# Patient Record
Sex: Female | Born: 1970
Health system: Southern US, Community
[De-identification: ages and names within clinical notes are randomized; demographics above are authoritative.]

## PROBLEM LIST (undated history)

## (undated) DIAGNOSIS — K5792 Diverticulitis of intestine, part unspecified, without perforation or abscess without bleeding: Secondary | ICD-10-CM

## (undated) DIAGNOSIS — E119 Type 2 diabetes mellitus without complications: Secondary | ICD-10-CM

## (undated) DIAGNOSIS — K589 Irritable bowel syndrome without diarrhea: Secondary | ICD-10-CM

## (undated) DIAGNOSIS — F4310 Post-traumatic stress disorder, unspecified: Secondary | ICD-10-CM

## (undated) DIAGNOSIS — I10 Essential (primary) hypertension: Secondary | ICD-10-CM

## (undated) HISTORY — PX: ABDOMINAL HYSTERECTOMY: SHX81

---

## 2001-08-03 ENCOUNTER — Emergency Department (HOSPITAL_COMMUNITY): Admission: EM | Admit: 2001-08-03 | Discharge: 2001-08-03 | Payer: Self-pay | Admitting: Emergency Medicine

## 2003-09-13 ENCOUNTER — Emergency Department (HOSPITAL_COMMUNITY): Admission: EM | Admit: 2003-09-13 | Discharge: 2003-09-13 | Payer: Self-pay | Admitting: Emergency Medicine

## 2005-04-26 ENCOUNTER — Emergency Department: Payer: Self-pay | Admitting: Emergency Medicine

## 2005-08-18 ENCOUNTER — Ambulatory Visit: Admission: RE | Admit: 2005-08-18 | Discharge: 2005-08-18 | Payer: Self-pay | Admitting: Family Medicine

## 2005-09-10 ENCOUNTER — Ambulatory Visit: Payer: Self-pay | Admitting: Pulmonary Disease

## 2005-12-04 ENCOUNTER — Emergency Department (HOSPITAL_COMMUNITY): Admission: EM | Admit: 2005-12-04 | Discharge: 2005-12-05 | Payer: Self-pay | Admitting: Emergency Medicine

## 2006-05-21 ENCOUNTER — Observation Stay (HOSPITAL_COMMUNITY): Admission: EM | Admit: 2006-05-21 | Discharge: 2006-05-22 | Payer: Self-pay | Admitting: Emergency Medicine

## 2006-06-04 ENCOUNTER — Encounter (HOSPITAL_COMMUNITY): Admission: RE | Admit: 2006-06-04 | Discharge: 2006-07-04 | Payer: Self-pay | Admitting: Family Medicine

## 2006-10-26 ENCOUNTER — Emergency Department (HOSPITAL_COMMUNITY): Admission: EM | Admit: 2006-10-26 | Discharge: 2006-10-26 | Payer: Self-pay | Admitting: Emergency Medicine

## 2008-09-08 ENCOUNTER — Emergency Department (HOSPITAL_COMMUNITY): Admission: EM | Admit: 2008-09-08 | Discharge: 2008-09-08 | Payer: Self-pay | Admitting: Emergency Medicine

## 2008-11-21 ENCOUNTER — Emergency Department (HOSPITAL_COMMUNITY): Admission: EM | Admit: 2008-11-21 | Discharge: 2008-11-22 | Payer: Self-pay | Admitting: Emergency Medicine

## 2008-11-29 ENCOUNTER — Ambulatory Visit (HOSPITAL_COMMUNITY): Admission: RE | Admit: 2008-11-29 | Discharge: 2008-11-29 | Payer: Self-pay | Admitting: Orthopaedic Surgery

## 2010-07-21 LAB — GLUCOSE, CAPILLARY: Glucose-Capillary: 102 mg/dL — ABNORMAL HIGH (ref 70–99)

## 2010-08-31 NOTE — Procedures (Signed)
NAMEMIDGE, MOMON NO.:  [id number]   MEDICAL RECORD NO.:  [id number]          PATIENT TYPE:  OUT   LOCATION:  SLEEP LAB                     FACILITY:  [facility]   PHYSICIAN:  Marcelyn Bruins, M.D. [facility] DATE OF BIRTH:  September 24, 1970   DATE OF STUDY:  08/18/2005                              NOCTURNAL POLYSOMNOGRAM   REFERRING PHYSICIAN:  Dr. Lubertha South   INDICATION FOR STUDY:  Hypersomnia with sleep apnea.   EPWORTH SCORE:  10.   SLEEP ARCHITECTURE:  The patient had a total sleep time of 355 minutes with  adequate REM but decreased slow wave sleep for her age.  Sleep onset latency  was normal as was REM onset.  Sleep efficiency was fairly good 92%.   RESPIRATORY DATA:  The patient was found to have 86 hypopneas and 11  obstructive apneas for a respiratory disturbance index of 16 events per  hour.  The events occurred primarily during REM and were not positional.  There was loud snoring noted throughout.  The patient did not meet split  night criteria secondary to the majority of her events occurring after 1  a.m..   OXYGEN DATA:  There was O2 desaturation as low as 83% with her obstructive  events.   CARDIAC:  No clinically significant cardiac arrhythmias.   MOVEMENT/PARASOMNIA:  Small numbers of leg jerks with no significant sleep  disruption.   IMPRESSION/RECOMMENDATION:  Mild obstructive sleep apnea/hypopnea syndrome  with a respiratory disturbance index of 16 events per hour and oxygen  desaturation as low as 83%.  The events occurred primarily during REM.  The  patient did not meet split night criteria secondary to the majority of  events occurring after 1a.m.  Treatment for this degree of sleep apnea can  include weight loss alone if applicable, upper airway surgery, oral  appliance, REM suppressant therapy, and also CPAP.  Clinical correlation is  suggested.                                            ______________________________  Marcelyn Bruins, M.D.  [facility]  Diplomate, American Board of Sleep  Medicine     KC/MEDQ  D:  09/10/2005 12:25:35  T:  09/10/2005 12:58:57  Job:  [id number]

## 2010-08-31 NOTE — Discharge Summary (Signed)
Lisa [name], Lisa [name] NO.:  [id number]   MEDICAL RECORD NO.:  [id number]                  PATIENT TYPE:   LOCATION:                                       FACILITY:   PHYSICIAN:  W. Simone Curia, M.D.             DATE OF BIRTH:   DATE OF ADMISSION:  DATE OF DISCHARGE:                                 DISCHARGE SUMMARY   BRIEF HISTORY:  I was asked by the ER physicians to assess this patient.  She has had chest pain for the past couple days, at times sharp, at times  achy and the patient does smoke, no history of hypertension or diabetes, she  has had some ongoing stress.  The patient had serial cardiac enzymes in the  emergency room which were negative along with EKG which was unremarkable.  The patient has had no nausea, shortness of breath, or diaphoresis.   REVIEW OF SYSTEMS:  Vital signs stable, lungs clear, HEENT normal, distinct  costochondral tenderness, costosternal tenderness right greater than left.  EKG normal.  Enzymes negative.   IMPRESSION:  Chest pain, highly doubt cardiac, discuss with patient.   PLAN:  Anti-inflammatory medicine regularly, add Vicodin p.r.n., follow up  in the office for further evaluation.     ___________________________________________                                         Donna Bernard, M.D.   WSL/MEDQ  D:  09/16/2003  T:  09/17/2003  Job:  [id number]

## 2010-08-31 NOTE — Discharge Summary (Signed)
NAMEMARKESHIA, [name] NO.:  [id number]   MEDICAL RECORD NO.:  [id number]          PATIENT TYPE:  OBV   LOCATION:  2031                         FACILITY:  [facility]   PHYSICIAN:  Dani Gobble, MD       DATE OF BIRTH:  Oct 07, 1970   DATE OF ADMISSION:  05/21/2006  DATE OF DISCHARGE:  05/22/2006                               DISCHARGE SUMMARY   DISCHARGE DIAGNOSES:  1. Chest pain.  History is negative stress test in past.      a.     Negative D-dimer.      b.     Negative myocardial infarction.  2. Presyncope, resolved.  3. Hypertension.  4. Tobacco abuse, instructed on cessation.  5. Obesity.   DISCHARGE CONDITION:  Improved.   DISCHARGE MEDICATIONS:  1. Aspirin 81 mg daily.  2. Lopressor 25 mg twice a day.  3. Protonix 40 mg daily.   DISCHARGE INSTRUCTIONS:  1. Activity without restrictions.  2. A low-sodium Heart Healthy diet.  3. Followup with Dr. Domingo Sep as needed.   HISTORY OF PRESENT ILLNESS:  A 40 year old African-American female  presented to the emergency room at [facility] with complaints of chest pain.  Her symptoms started on May 21, 2006 while at rest.  Described as a  tightness substernally with radiation down her left arm and with arm  heaviness.  The pain was described 9/10 on a scale and associated with  shortness of breath, nausea, and diaphoresis as well as presyncope.  She  has had chest pain in the past, was seen and evaluated by Dr. Domingo Sep,  a negative stress test and negative Holter.  Her symptoms had been  persistent and intermittent since September '07.  Last episode in  January '08 with similar qualities, but the episode of May 21, 2006  was more severe.  She continued in the ER to have chest pain.  Had  received nitro and Vicodin without relief.   PAST MEDICAL HISTORY:  1. Obesity.  2. Hypertension.  3. Dyslipidemia.  4. Negative Myoview in September '07.   ALLERGIES:  NO KNOWN ALLERGIES.   OUTPATIENT MEDS:  1.  Aspirin 81.  2. Lopressor b.i.d.   FAMILY HISTORY/SOCIAL HISTORY, REVIEW OF SYSTEMS:  See H&P.   PHYSICAL EXAM AT DISCHARGE:  VITAL SIGNS:  Blood pressure 116/81, pulse  65, respi is 18, temp 97.2, oxygen saturation on 2 L 100%.  HEART:  S1, S2, regular rate and rhythm.  LUNGS:  Clear.  ABDOMEN:  Obese, positive bowel sounds.   LABORATORY DATA:  Cardiac enzymes, CK-MB:  CK is 71, 95, 87, MB 1.0,  1.0, 0.8.  Troponin-I 0.01, negative MI.  Total cholesterol on February  12:  LDL 157, HDL 31, triglycerides 160.  Magnesium 2.3.  TSH 1.526.  Glycohemoglobin 5.8.  D-dimer less than 0.22.  Hemoglobin 13.6,  hematocrit 38.5, WBC 9.2, platelets 327, BUN 11, creatinine 0.8.   Chest x-ray:  No acute findings.  EKG:  Sinus rhythm, non-specific T-  wave abnormality.   HOSPITAL COURSE:  [name] was admitted by Dr. Tresa Endo  on May 21, 2006  after experiencing chest pain.  She was admitted to a monitored bed.  Serial CK-MBs were done.   Enzymes were negative.  PT was added to medical regimen.  She was seen  and discharged by Dr. Jacinto Halim the next day with no further episodes.  The  tobacco cessation nurse also talked to her, she smokes a half pack per  day, and could not afford any of the med aids available.  She ambulated  without problems and was discharged by Lisa [name], standing nurse  practitioner, on that same day.      Lisa [name]. Lisa [name], N.P.    ______________________________  Dani Gobble, MD    LRI/MEDQ  D:  08/04/2006  T:  08/04/2006  Job:  [id number]   cc:   Dani Gobble, MD

## 2010-09-01 ENCOUNTER — Emergency Department (HOSPITAL_COMMUNITY)
Admission: EM | Admit: 2010-09-01 | Discharge: 2010-09-01 | Disposition: A | Discharge status: Home / Self Care | Payer: Managed Care, Other (non HMO) | Attending: Emergency Medicine | Admitting: Emergency Medicine

## 2010-09-01 ENCOUNTER — Emergency Department (HOSPITAL_COMMUNITY): Payer: Managed Care, Other (non HMO)

## 2010-09-01 DIAGNOSIS — E119 Type 2 diabetes mellitus without complications: Secondary | ICD-10-CM | POA: Insufficient documentation

## 2010-09-01 DIAGNOSIS — K089 Disorder of teeth and supporting structures, unspecified: Secondary | ICD-10-CM | POA: Insufficient documentation

## 2010-09-01 DIAGNOSIS — L03211 Cellulitis of face: Secondary | ICD-10-CM | POA: Insufficient documentation

## 2010-09-01 DIAGNOSIS — E7800 Pure hypercholesterolemia, unspecified: Secondary | ICD-10-CM | POA: Insufficient documentation

## 2010-09-01 DIAGNOSIS — L0201 Cutaneous abscess of face: Secondary | ICD-10-CM | POA: Insufficient documentation

## 2010-09-01 DIAGNOSIS — I10 Essential (primary) hypertension: Secondary | ICD-10-CM | POA: Insufficient documentation

## 2010-09-01 DIAGNOSIS — R220 Localized swelling, mass and lump, head: Secondary | ICD-10-CM | POA: Insufficient documentation

## 2010-09-01 LAB — BASIC METABOLIC PANEL
BUN: 12 mg/dL (ref 6–23)
Calcium: 10.2 mg/dL (ref 8.4–10.5)
Creatinine, Ser: 1 mg/dL (ref 0.4–1.2)
GFR calc non Af Amer: >60 mL/min (ref >60)
Glucose, Bld: 110 mg/dL — ABNORMAL HIGH (ref 70–99)

## 2010-09-01 LAB — CBC
HCT: 38.3 % (ref 36.0–46.0)
MCH: 31.2 pg (ref 26.0–34.0)
MCHC: 34.7 g/dL (ref 30.0–36.0)
MCV: 89.9 fL (ref 78.0–100.0)
Platelets: 295 10*3/uL (ref 150–400)
RDW: 13.8 % (ref 11.5–15.5)

## 2010-09-01 LAB — DIFFERENTIAL
Eosinophils Absolute: 0.3 10*3/uL (ref 0.0–0.7)
Eosinophils Relative: 2 % (ref 0–5)
Lymphocytes Relative: 24 % (ref 12–46)
Lymphs Abs: 3.2 10*3/uL (ref 0.7–4.0)
Monocytes Absolute: 0.8 10*3/uL (ref 0.1–1.0)

## 2010-09-01 MED ORDER — IOHEXOL 300 MG/ML  SOLN
75.0000 mL | Freq: Once | INTRAMUSCULAR | Status: AC | PRN
  Administered 2010-09-01: 75 mL via INTRAVENOUS

## 2012-01-28 ENCOUNTER — Encounter (HOSPITAL_COMMUNITY): Payer: Self-pay | Admitting: *Deleted

## 2012-01-28 ENCOUNTER — Emergency Department (INDEPENDENT_AMBULATORY_CARE_PROVIDER_SITE_OTHER)
Admission: EM | Admit: 2012-01-28 | Discharge: 2012-01-28 | Disposition: A | Discharge status: Home / Self Care | Payer: Managed Care, Other (non HMO) | Source: Home / Self Care | Attending: Family Medicine | Admitting: Family Medicine

## 2012-01-28 DIAGNOSIS — S0003XA Contusion of scalp, initial encounter: Secondary | ICD-10-CM

## 2012-01-28 DIAGNOSIS — S0093XA Contusion of unspecified part of head, initial encounter: Secondary | ICD-10-CM

## 2012-01-28 DIAGNOSIS — R1110 Vomiting, unspecified: Secondary | ICD-10-CM

## 2012-01-28 HISTORY — DX: Type 2 diabetes mellitus without complications: E11.9

## 2012-01-28 HISTORY — DX: Essential (primary) hypertension: I10

## 2012-01-28 MED ORDER — ONDANSETRON 4 MG PO TBDP
ORAL_TABLET | ORAL | Status: AC
  Filled 2012-01-28: qty 1

## 2012-01-28 MED ORDER — ONDANSETRON 4 MG PO TBDP
4.0000 mg | ORAL_TABLET | Freq: Once | ORAL | Status: AC
  Administered 2012-01-28: 4 mg via ORAL

## 2012-01-28 NOTE — ED Notes (Signed)
Pt  Reports        She  Raised  Yesterday  andBumped  Her  Head  On kitchen top   vomitng     As  Well as  Nausea    She  At  This  Time  Is  Awake  As  Well  As   Alert  And  Oriented

## 2012-01-28 NOTE — ED Provider Notes (Signed)
History     CSN: [id number]  Arrival date & time 01/28/12  1108   First MD Initiated Contact with Patient 01/28/12 1113      Chief Complaint  Patient presents with  . Head Injury    (Consider location/radiation/quality/duration/timing/severity/associated sxs/prior treatment) Patient is a 41 y.o. female presenting with head injury. The history is provided by the patient.  Head Injury  The incident occurred 12 to 24 hours ago. She came to the ER via walk-in. The injury mechanism was a direct blow (struck left upper forehead on kitchen counter last eve, had n/v this am.). There was no loss of consciousness. There was no blood loss. The quality of the pain is described as sharp. The pain is mild. Associated symptoms include vomiting.    Past Medical History  Diagnosis Date  . Diabetes mellitus without complication   . Hypertension     Past Surgical History  Procedure Date  . Abdominal hysterectomy     No family history on file.  History  Substance Use Topics  . Smoking status: Not on file  . Smokeless tobacco: Not on file  . Alcohol Use: No    OB History    Grav Para Term Preterm Abortions TAB SAB Ect Mult Living                  Review of Systems  Constitutional: Negative.   HENT: Negative.   Respiratory: Negative.   Gastrointestinal: Positive for nausea and vomiting. Negative for diarrhea.  Skin: Positive for wound.  Neurological: Negative.     Allergies  Review of patient's allergies indicates not on file.  Home Medications  No current outpatient prescriptions on file.  BP 151/96  Pulse 72  Temp 97.4 F (36.3 C) (Oral)  Resp 18  LMP 01/28/2012  Physical Exam  Nursing note and vitals reviewed. Constitutional: She is oriented to person, place, and time. She appears well-developed and well-nourished.  HENT:  Head: Normocephalic.    Right Ear: External ear normal.  Mouth/Throat: Oropharynx is clear and moist.  Eyes: Conjunctivae normal are  normal. Pupils are equal, round, and reactive to light.  Neck: Normal range of motion. Neck supple.  Abdominal: Soft. Bowel sounds are normal. She exhibits no distension and no mass. There is no tenderness. There is no rebound and no guarding.  Neurological: She is alert and oriented to person, place, and time. No cranial nerve deficit.  Skin: Skin is warm and dry.  Psychiatric: She has a normal mood and affect. Her behavior is normal. Judgment and thought content normal.    ED Course  Procedures (including critical care time)  Labs Reviewed - No data to display No results found.   1. Head contusion   2. Vomiting alone       MDM          Linna Hoff, MD 01/28/12 [phone]

## 2012-02-13 ENCOUNTER — Ambulatory Visit (INDEPENDENT_AMBULATORY_CARE_PROVIDER_SITE_OTHER): Payer: Managed Care, Other (non HMO) | Admitting: Physician Assistant

## 2012-02-13 VITALS — BP 117/89 | HR 88 | Temp 98.9°F | Resp 18 | Ht 68.0 in | Wt 255.0 lb

## 2012-02-13 DIAGNOSIS — J029 Acute pharyngitis, unspecified: Secondary | ICD-10-CM

## 2012-02-13 LAB — POCT RAPID STREP A (OFFICE): Rapid Strep A Screen: NEGATIVE

## 2012-02-13 MED ORDER — MAGIC MOUTHWASH W/LIDOCAINE: 10.0000 mL | ORAL | Status: DC | PRN

## 2012-02-13 MED ORDER — AMOXICILLIN 875 MG PO TABS: 875.0000 mg | ORAL_TABLET | Freq: Two times a day (BID) | ORAL | Status: DC

## 2012-02-13 NOTE — Progress Notes (Signed)
  Subjective:    Patient ID: Lisa [name], female    DOB: April 25, 1970, 41 y.o.   MRN: [id number]  HPI 41 year old female presents with acute onset of sore throat yesterday.  States symptoms have worsened today and she complains of chills and some body aches. No documented fever. Denies nausea, vomiting, headache, cough, or nasal congestion. Has taken ibuprofen which has not helped much.      Review of Systems  Constitutional: Positive for chills. Negative for fever.  HENT: Positive for sore throat and postnasal drip. Negative for congestion, rhinorrhea and neck pain.   Gastrointestinal: Negative for nausea, vomiting and abdominal pain.  Skin: Negative for rash.  All other systems reviewed and are negative.       Objective:   Physical Exam  Constitutional: She is oriented to person, place, and time. She appears well-developed and well-nourished.  HENT:  Head: Normocephalic and atraumatic.  Right Ear: External ear normal.  Left Ear: External ear normal.  Mouth/Throat: No oropharyngeal exudate (tonsillar erythema).  Eyes: Conjunctivae normal are normal.  Neck: Normal range of motion.  Cardiovascular: Normal rate, regular rhythm and normal heart sounds.   Pulmonary/Chest: Effort normal and breath sounds normal.  Lymphadenopathy:    She has no cervical adenopathy.  Neurological: She is alert and oriented to person, place, and time.  Psychiatric: She has a normal mood and affect. Her behavior is normal. Judgment and thought content normal.          Assessment & Plan:   1. Acute pharyngitis  POCT rapid strep A, Alum & Mag Hydroxide-Simeth (MAGIC MOUTHWASH W/LIDOCAINE) SOLN, amoxicillin (AMOXIL) 875 MG tablet   Continue ibuprofen prn pain Duke's mouthwash as needed If no improvement in 48-72 hours, may fill amoxicillin Follow up if symptoms worsen or fail to improve

## 2012-07-21 ENCOUNTER — Other Ambulatory Visit: Payer: Self-pay

## 2012-07-21 ENCOUNTER — Emergency Department (HOSPITAL_BASED_OUTPATIENT_CLINIC_OR_DEPARTMENT_OTHER): Payer: Worker's Compensation

## 2012-07-21 ENCOUNTER — Encounter (HOSPITAL_BASED_OUTPATIENT_CLINIC_OR_DEPARTMENT_OTHER): Payer: Self-pay | Admitting: *Deleted

## 2012-07-21 ENCOUNTER — Emergency Department (HOSPITAL_BASED_OUTPATIENT_CLINIC_OR_DEPARTMENT_OTHER)
Admission: EM | Admit: 2012-07-21 | Discharge: 2012-07-21 | Disposition: A | Discharge status: Home / Self Care | Payer: Worker's Compensation | Attending: Emergency Medicine | Admitting: Emergency Medicine

## 2012-07-21 DIAGNOSIS — Y990 Civilian activity done for income or pay: Secondary | ICD-10-CM | POA: Insufficient documentation

## 2012-07-21 DIAGNOSIS — S62639A Displaced fracture of distal phalanx of unspecified finger, initial encounter for closed fracture: Secondary | ICD-10-CM | POA: Insufficient documentation

## 2012-07-21 DIAGNOSIS — W3189XA Contact with other specified machinery, initial encounter: Secondary | ICD-10-CM | POA: Insufficient documentation

## 2012-07-21 DIAGNOSIS — E119 Type 2 diabetes mellitus without complications: Secondary | ICD-10-CM | POA: Insufficient documentation

## 2012-07-21 DIAGNOSIS — I10 Essential (primary) hypertension: Secondary | ICD-10-CM | POA: Insufficient documentation

## 2012-07-21 DIAGNOSIS — Y9269 Other specified industrial and construction area as the place of occurrence of the external cause: Secondary | ICD-10-CM | POA: Insufficient documentation

## 2012-07-21 DIAGNOSIS — F17200 Nicotine dependence, unspecified, uncomplicated: Secondary | ICD-10-CM | POA: Insufficient documentation

## 2012-07-21 MED ORDER — TRAMADOL HCL 50 MG PO TABS: 50.0000 mg | ORAL_TABLET | Freq: Four times a day (QID) | ORAL | Status: DC | PRN

## 2012-07-21 NOTE — ED Notes (Signed)
Patient transported to X-ray 

## 2012-07-21 NOTE — ED Notes (Signed)
Pt mashed her left 4th finger in a machine while at work. Bruising noted under her nail bed with some swelling.

## 2012-07-21 NOTE — ED Provider Notes (Signed)
History     CSN: [id number]  Arrival date & time 07/21/12  7829   None     Chief Complaint  Patient presents with  . Finger Injury    (Consider location/radiation/quality/duration/timing/severity/associated sxs/prior treatment) HPI Comments: Patient presents to the ER for evaluation of injury to left ring finger. Patient reports that the injury occurred while at work, just prior to arrival. Patient reports a crush type injury in a piece of machinery. Patient is complaining of pain at the distal IP joint area. Pain is moderate, worsens with movement.   Past Medical History  Diagnosis Date  . Diabetes mellitus without complication   . Hypertension     Past Surgical History  Procedure Laterality Date  . Abdominal hysterectomy      Family History  Problem Relation Age of Onset  . COPD Mother   . Congestive Heart Failure Father     History  Substance Use Topics  . Smoking status: Current Every Day Smoker  . Smokeless tobacco: Not on file  . Alcohol Use: No    OB History   Grav Para Term Preterm Abortions TAB SAB Ect Mult Living                  Review of Systems  Musculoskeletal:       Finger pain    Allergies  Review of patient's allergies indicates no known allergies.  Home Medications   Current Outpatient Rx  Name  Route  Sig  Dispense  Refill  . cholecalciferol (VITAMIN D) 1000 UNITS tablet   Oral   Take 1,000 Units by mouth daily.         . Multiple Vitamin (MULTIVITAMIN) tablet   Oral   Take 1 tablet by mouth daily.         . Alum & Mag Hydroxide-Simeth (MAGIC MOUTHWASH W/LIDOCAINE) SOLN   Oral   Take 10 mLs by mouth every 2 (two) hours as needed. Use 1:1 ratio of viscous lidocaine and Duke's mouthwash   360 mL   0   . amoxicillin (AMOXIL) 875 MG tablet   Oral   Take 1 tablet (875 mg total) by mouth 2 (two) times daily.   14 tablet   0   . cyanocobalamin (,VITAMIN B-12,) 1000 MCG/ML injection   Intramuscular   Inject 1,000 mcg into  the muscle once.           BP 118/52  Pulse 90  Temp(Src) 98.1 F (36.7 C) (Oral)  Resp 20  Ht 5\' 6"  (1.676 m)  Wt 247 lb (112.038 kg)  BMI 39.89 kg/m2  SpO2 98%  LMP 01/28/2012  Physical Exam  Musculoskeletal:       Hands:   ED Course  Procedures (including critical care time)  Labs Reviewed - No data to display Dg Finger Ring Left  07/21/2012  *RADIOLOGY REPORT*  Clinical Data: Left fourth finger pain, after injury at work.  LEFT RING FINGER 2+V  Comparison: None.  Findings: There is question of a nondisplaced fracture line along the proximal aspect of the distal tuft of the fourth digit. Visualized joint spaces are preserved.  No significant soft tissue abnormalities are characterized on radiograph.  IMPRESSION: Question of a nondisplaced fracture line along the proximal aspect of the distal tuft of the fourth digit.  This may be artifactual in nature.   Original Report Authenticated By: Tonia Ghent, M.D.      Diagnosis: Crush injury left ring finger, possible tuft fracture  MDM  Patient comes to the ER for evaluation after a crush injury to her finger. Patient is complaining of pain in the area of the DIP joint. X-ray does not show any injury in this region, but there is a question of a tuft fracture. She does have a small amount of ecchymosis under the fingernail and therefore this finding, although subtle, it is likely a positive fracture. The fractures, however, did not require any treatment. Patient will be splinted for comfort. Analgesia as needed.       Gilda Crease, MD 07/21/12 [phone]

## 2012-07-21 NOTE — ED Notes (Signed)
Pt returned from xray

## 2012-12-11 ENCOUNTER — Encounter (HOSPITAL_COMMUNITY): Payer: Self-pay | Admitting: Emergency Medicine

## 2012-12-11 ENCOUNTER — Emergency Department (HOSPITAL_COMMUNITY): Payer: 59

## 2012-12-11 ENCOUNTER — Emergency Department (HOSPITAL_COMMUNITY)
Admission: EM | Admit: 2012-12-11 | Discharge: 2012-12-11 | Disposition: A | Discharge status: Home / Self Care | Payer: 59 | Attending: Emergency Medicine | Admitting: Emergency Medicine

## 2012-12-11 DIAGNOSIS — H539 Unspecified visual disturbance: Secondary | ICD-10-CM | POA: Insufficient documentation

## 2012-12-11 DIAGNOSIS — R110 Nausea: Secondary | ICD-10-CM | POA: Insufficient documentation

## 2012-12-11 DIAGNOSIS — F17200 Nicotine dependence, unspecified, uncomplicated: Secondary | ICD-10-CM | POA: Insufficient documentation

## 2012-12-11 DIAGNOSIS — H53149 Visual discomfort, unspecified: Secondary | ICD-10-CM | POA: Insufficient documentation

## 2012-12-11 DIAGNOSIS — Z79899 Other long term (current) drug therapy: Secondary | ICD-10-CM | POA: Insufficient documentation

## 2012-12-11 DIAGNOSIS — I10 Essential (primary) hypertension: Secondary | ICD-10-CM | POA: Insufficient documentation

## 2012-12-11 DIAGNOSIS — Z791 Long term (current) use of non-steroidal anti-inflammatories (NSAID): Secondary | ICD-10-CM | POA: Insufficient documentation

## 2012-12-11 DIAGNOSIS — R51 Headache: Secondary | ICD-10-CM | POA: Insufficient documentation

## 2012-12-11 DIAGNOSIS — E119 Type 2 diabetes mellitus without complications: Secondary | ICD-10-CM | POA: Insufficient documentation

## 2012-12-11 LAB — BASIC METABOLIC PANEL
BUN: 10 mg/dL (ref 6–23)
Calcium: 9.2 mg/dL (ref 8.4–10.5)
Chloride: 107 mEq/L (ref 96–112)
Creatinine, Ser: 1.01 mg/dL (ref 0.50–1.10)
GFR calc Af Amer: 79 mL/min — ABNORMAL LOW (ref >90)
GFR calc non Af Amer: 68 mL/min — ABNORMAL LOW (ref >90)

## 2012-12-11 LAB — CBC WITH DIFFERENTIAL/PLATELET
Basophils Absolute: 0 10*3/uL (ref 0.0–0.1)
Basophils Relative: 0 % (ref 0–1)
HCT: 36.3 % (ref 36.0–46.0)
MCHC: 35.5 g/dL (ref 30.0–36.0)
Monocytes Absolute: 0.6 10*3/uL (ref 0.1–1.0)
Neutro Abs: 7.1 10*3/uL (ref 1.7–7.7)
Platelets: 337 10*3/uL (ref 150–400)
RDW: 13.2 % (ref 11.5–15.5)

## 2012-12-11 MED ORDER — SODIUM CHLORIDE 0.9 % IV BOLUS (SEPSIS)
500.0000 mL | Freq: Once | INTRAVENOUS | Status: AC
  Administered 2012-12-11: 500 mL via INTRAVENOUS

## 2012-12-11 MED ORDER — DIPHENHYDRAMINE HCL 50 MG/ML IJ SOLN
12.5000 mg | Freq: Once | INTRAMUSCULAR | Status: AC
  Administered 2012-12-11: 12.5 mg via INTRAVENOUS
  Filled 2012-12-11: qty 1

## 2012-12-11 MED ORDER — KETOROLAC TROMETHAMINE 30 MG/ML IJ SOLN
30.0000 mg | Freq: Once | INTRAMUSCULAR | Status: AC
  Administered 2012-12-11: 30 mg via INTRAVENOUS
  Filled 2012-12-11: qty 1

## 2012-12-11 MED ORDER — METOCLOPRAMIDE HCL 5 MG/ML IJ SOLN
10.0000 mg | Freq: Once | INTRAMUSCULAR | Status: AC
  Administered 2012-12-11: 10 mg via INTRAVENOUS
  Filled 2012-12-11: qty 2

## 2012-12-11 MED ORDER — ONDANSETRON 8 MG PO TBDP
8.0000 mg | ORAL_TABLET | Freq: Once | ORAL | Status: AC
  Administered 2012-12-11: 8 mg via ORAL
  Filled 2012-12-11: qty 1

## 2012-12-11 NOTE — ED Notes (Addendum)
Pt reports having a headache that started three days ago. Pt denies prior history. Pt reports nausea and light sensitivity. Pt reports Pt was referred to ED by [facility]. Note from [facility] reads: "Referred to emergency department at [facility]. RE: headache and nausea, on 12/11/2012. Notes: Occipital and temporal headache, sudden onset associated with visual changes, nausea, and photophobia. Pt describes as worst headache of life 10/10. No history of migraines, pt has hx of hypertension - has been out of medications for one week."

## 2012-12-19 NOTE — ED Provider Notes (Signed)
CSN: [id number]     Arrival date & time 12/11/12  1652 History   First MD Initiated Contact with Patient 12/11/12 1712     Chief Complaint  Patient presents with  . Headache   (Consider location/radiation/quality/duration/timing/severity/associated sxs/prior Treatment) Patient is a 42 y.o. female presenting with headaches. The history is provided by the patient and the spouse. No language interpreter was used.  Headache Pain location:  Occipital and R temporal Associated symptoms: nausea and photophobia   Associated symptoms: no abdominal pain, no fever, no myalgias, no neck pain, no neck stiffness, no sinus pressure and no vomiting   Associated symptoms comment:  Headache, nausea, visual changes that include blurred vision and walking off balance. No lateralizing weakness. No history of headaches previously. She was seen prior to arrival at [facility] and was advised to come here for evaluation of headache and concern for blood pressure as she has not been taking her medication. No recent fever. No vomiting, only nausea. Light makes the headache worse. No known tick exposure.   Past Medical History  Diagnosis Date  . Diabetes mellitus without complication   . Hypertension    Past Surgical History  Procedure Laterality Date  . Abdominal hysterectomy     Family History  Problem Relation Age of Onset  . COPD Mother   . Congestive Heart Failure Father    History  Substance Use Topics  . Smoking status: Current Every Day Smoker -- 1.00 packs/day for 20 years    Types: Cigarettes  . Smokeless tobacco: Never Used  . Alcohol Use: No   OB History   Grav Para Term Preterm Abortions TAB SAB Ect Mult Living                 Review of Systems  Constitutional: Negative for fever and chills.  HENT: Negative for neck pain, neck stiffness and sinus pressure.   Eyes: Positive for photophobia and visual disturbance.  Respiratory: Negative.  Negative for shortness of breath.    Cardiovascular: Negative.  Negative for chest pain.  Gastrointestinal: Positive for nausea. Negative for vomiting and abdominal pain.  Genitourinary: Negative.  Negative for dysuria.  Musculoskeletal: Negative.  Negative for myalgias.  Skin: Negative.  Negative for rash.  Neurological: Positive for headaches. Negative for speech difficulty.  Psychiatric/Behavioral: Negative for confusion.    Allergies  Ambien  Home Medications   Current Outpatient Rx  Name  Route  Sig  Dispense  Refill  . amLODipine-benazepril (LOTREL) 5-40 MG per capsule   Oral   Take 1 capsule by mouth daily.         . cholecalciferol (VITAMIN D) 1000 UNITS tablet   Oral   Take 1,000 Units by mouth daily.         Marland Kitchen estradiol (VIVELLE-DOT) 0.075 MG/24HR   Transdermal   Place 1 patch onto the skin 2 (two) times a week.         . Multiple Vitamin (MULTIVITAMIN) tablet   Oral   Take 1 tablet by mouth daily.         . naproxen sodium (ANAPROX) 220 MG tablet   Oral   Take 440 mg by mouth 2 (two) times daily with a meal.         . valsartan-hydrochlorothiazide (DIOVAN-HCT) 320-25 MG per tablet   Oral   Take 1 tablet by mouth daily.          BP 121/69  Pulse 61  Temp(Src) 98.3 F (36.8 C) (  Oral)  Resp 18  Ht 5\' 5"  (1.651 m)  Wt 248 lb (112.492 kg)  BMI 41.27 kg/m2  SpO2 96%  LMP 01/28/2012 Physical Exam  Constitutional: She is oriented to person, place, and time. She appears well-developed and well-nourished.  HENT:  Head: Normocephalic.  Eyes: Pupils are equal, round, and reactive to light.  Neck: Normal range of motion. Neck supple.  Cardiovascular: Normal rate and regular rhythm.   Pulmonary/Chest: Effort normal and breath sounds normal.  Abdominal: Soft. Bowel sounds are normal. There is no tenderness. There is no rebound and no guarding.  Musculoskeletal: Normal range of motion.  Neurological: She is alert and oriented to person, place, and time. She has normal strength and  normal reflexes. No sensory deficit. She displays a negative Romberg sign. Coordination normal.  Ambulatory without imbalance. Cranial Nerves 3-12 grossly intact.   Skin: Skin is warm and dry. No rash noted.  Psychiatric: She has a normal mood and affect.    ED Course  Procedures (including critical care time) Labs Review Labs Reviewed  BASIC METABOLIC PANEL - Abnormal; Notable for the following:    GFR calc non Af Amer 68 (*)    GFR calc Af Amer 79 (*)    All other components within normal limits  CBC WITH DIFFERENTIAL - Abnormal; Notable for the following:    WBC 13.1 (*)    Lymphs Abs 5.0 (*)    All other components within normal limits   Imaging Review No results found.  MDM   1. Headache    Headache is better with IV headache cocktail. She reports feeling comfortable going home. Normal neurologic exam is without deficit. Doubt IC bleed. Stable for discharge.   Arnoldo Hooker, PA-C 12/19/12 1743

## 2012-12-19 NOTE — ED Provider Notes (Signed)
Medical screening examination/treatment/procedure(s) were performed by non-physician practitioner and as supervising physician I was immediately available for consultation/collaboration.  Lyanne Co, MD 12/19/12 Zollie Pee

## 2015-03-11 ENCOUNTER — Encounter (HOSPITAL_COMMUNITY): Payer: Self-pay | Admitting: Neurology

## 2015-03-11 ENCOUNTER — Emergency Department (HOSPITAL_COMMUNITY): Payer: 59

## 2015-03-11 ENCOUNTER — Inpatient Hospital Stay (HOSPITAL_COMMUNITY)
Admission: EM | Admit: 2015-03-11 | Discharge: 2015-03-13 | DRG: 872 | Disposition: A | Discharge status: Home / Self Care | Payer: 59 | Attending: Internal Medicine | Admitting: Internal Medicine

## 2015-03-11 DIAGNOSIS — D72829 Elevated white blood cell count, unspecified: Secondary | ICD-10-CM | POA: Diagnosis present

## 2015-03-11 DIAGNOSIS — E669 Obesity, unspecified: Secondary | ICD-10-CM | POA: Diagnosis present

## 2015-03-11 DIAGNOSIS — I10 Essential (primary) hypertension: Secondary | ICD-10-CM | POA: Diagnosis present

## 2015-03-11 DIAGNOSIS — R109 Unspecified abdominal pain: Secondary | ICD-10-CM | POA: Diagnosis not present

## 2015-03-11 DIAGNOSIS — Z720 Tobacco use: Secondary | ICD-10-CM | POA: Diagnosis present

## 2015-03-11 DIAGNOSIS — Z6839 Body mass index (BMI) 39.0-39.9, adult: Secondary | ICD-10-CM | POA: Diagnosis not present

## 2015-03-11 DIAGNOSIS — E119 Type 2 diabetes mellitus without complications: Secondary | ICD-10-CM | POA: Diagnosis present

## 2015-03-11 DIAGNOSIS — A419 Sepsis, unspecified organism: Secondary | ICD-10-CM | POA: Diagnosis not present

## 2015-03-11 DIAGNOSIS — K5732 Diverticulitis of large intestine without perforation or abscess without bleeding: Secondary | ICD-10-CM | POA: Diagnosis present

## 2015-03-11 DIAGNOSIS — F17210 Nicotine dependence, cigarettes, uncomplicated: Secondary | ICD-10-CM | POA: Diagnosis present

## 2015-03-11 DIAGNOSIS — R000 Tachycardia, unspecified: Secondary | ICD-10-CM | POA: Diagnosis present

## 2015-03-11 DIAGNOSIS — K589 Irritable bowel syndrome without diarrhea: Secondary | ICD-10-CM | POA: Diagnosis present

## 2015-03-11 DIAGNOSIS — K5792 Diverticulitis of intestine, part unspecified, without perforation or abscess without bleeding: Secondary | ICD-10-CM | POA: Diagnosis present

## 2015-03-11 HISTORY — DX: Irritable bowel syndrome, unspecified: K58.9

## 2015-03-11 HISTORY — DX: Diverticulitis of intestine, part unspecified, without perforation or abscess without bleeding: K57.92

## 2015-03-11 LAB — LIPASE, BLOOD: Lipase: 40 U/L (ref 11–51)

## 2015-03-11 LAB — CBC
HCT: 43.4 % (ref 36.0–46.0)
HEMOGLOBIN: 15.2 g/dL — AB (ref 12.0–15.0)
MCH: 31.8 pg (ref 26.0–34.0)
MCHC: 35 g/dL (ref 30.0–36.0)
MCV: 90.8 fL (ref 78.0–100.0)
Platelets: 331 10*3/uL (ref 150–400)
RBC: 4.78 MIL/uL (ref 3.87–5.11)
RDW: 13.5 % (ref 11.5–15.5)
WBC: 17.9 10*3/uL — ABNORMAL HIGH (ref 4.0–10.5)

## 2015-03-11 LAB — COMPREHENSIVE METABOLIC PANEL
ALK PHOS: 110 U/L (ref 38–126)
ALT: 21 U/L (ref 14–54)
ANION GAP: 8 (ref 5–15)
AST: 18 U/L (ref 15–41)
Albumin: 4.1 g/dL (ref 3.5–5.0)
BUN: 9 mg/dL (ref 6–20)
CALCIUM: 9.6 mg/dL (ref 8.9–10.3)
CO2: 26 mmol/L (ref 22–32)
Chloride: 102 mmol/L (ref 101–111)
Creatinine, Ser: 0.84 mg/dL (ref 0.44–1.00)
GFR calc non Af Amer: >60 mL/min (ref >60)
Glucose, Bld: 140 mg/dL — ABNORMAL HIGH (ref 65–99)
POTASSIUM: 3.9 mmol/L (ref 3.5–5.1)
SODIUM: 136 mmol/L (ref 135–145)
TOTAL PROTEIN: 7 g/dL (ref 6.5–8.1)
Total Bilirubin: 0.6 mg/dL (ref 0.3–1.2)

## 2015-03-11 LAB — URINE MICROSCOPIC-ADD ON: BACTERIA UA: NONE SEEN

## 2015-03-11 LAB — URINALYSIS, ROUTINE W REFLEX MICROSCOPIC
Bilirubin Urine: NEGATIVE
Glucose, UA: NEGATIVE mg/dL
Ketones, ur: NEGATIVE mg/dL
Leukocytes, UA: NEGATIVE
NITRITE: NEGATIVE
Protein, ur: NEGATIVE mg/dL
SPECIFIC GRAVITY, URINE: 1.019 (ref 1.005–1.030)
pH: 5.5 (ref 5.0–8.0)

## 2015-03-11 LAB — GLUCOSE, CAPILLARY
Glucose-Capillary: 121 mg/dL — ABNORMAL HIGH (ref 65–99)
Glucose-Capillary: 106 mg/dL — ABNORMAL HIGH (ref 65–99)

## 2015-03-11 LAB — TSH: TSH: 2.335 u[IU]/mL (ref 0.350–4.500)

## 2015-03-11 MED ORDER — CLONIDINE HCL 0.1 MG PO TABS: 0.1000 mg | ORAL_TABLET | Freq: Every day | ORAL | Status: DC

## 2015-03-11 MED ORDER — INSULIN ASPART 100 UNIT/ML ~~LOC~~ SOLN
0.0000 [IU] | Freq: Three times a day (TID) | SUBCUTANEOUS | Status: DC
  Administered 2015-03-11: 1 [IU] via SUBCUTANEOUS

## 2015-03-11 MED ORDER — MORPHINE SULFATE (PF) 2 MG/ML IV SOLN
1.0000 mg | INTRAVENOUS | Status: DC | PRN
  Administered 2015-03-11 – 2015-03-12 (×2): 1 mg via INTRAVENOUS
  Filled 2015-03-11 (×4): qty 1

## 2015-03-11 MED ORDER — METRONIDAZOLE IN NACL 5-0.79 MG/ML-% IV SOLN
500.0000 mg | Freq: Three times a day (TID) | INTRAVENOUS | Status: DC
  Administered 2015-03-11 – 2015-03-13 (×5): 500 mg via INTRAVENOUS
  Filled 2015-03-11 (×7): qty 100

## 2015-03-11 MED ORDER — HYDROMORPHONE HCL 1 MG/ML IJ SOLN: 1.0000 mg | Freq: Once | INTRAMUSCULAR | Status: DC

## 2015-03-11 MED ORDER — AMLODIPINE BESYLATE 5 MG PO TABS
5.0000 mg | ORAL_TABLET | Freq: Every day | ORAL | Status: DC
  Administered 2015-03-11 – 2015-03-13 (×3): 5 mg via ORAL
  Filled 2015-03-11 (×3): qty 1

## 2015-03-11 MED ORDER — HYDROMORPHONE HCL 1 MG/ML IJ SOLN
1.0000 mg | Freq: Once | INTRAMUSCULAR | Status: AC
  Administered 2015-03-11: 1 mg via INTRAVENOUS
  Filled 2015-03-11: qty 1

## 2015-03-11 MED ORDER — INSULIN ASPART 100 UNIT/ML ~~LOC~~ SOLN: 0.0000 [IU] | Freq: Every day | SUBCUTANEOUS | Status: DC

## 2015-03-11 MED ORDER — ESTRADIOL 0.1 MG/24HR TD PTWK
0.1000 mg | MEDICATED_PATCH | TRANSDERMAL | Status: DC
  Filled 2015-03-11: qty 1

## 2015-03-11 MED ORDER — HYDRALAZINE HCL 20 MG/ML IJ SOLN: 10.0000 mg | Freq: Three times a day (TID) | INTRAMUSCULAR | Status: DC | PRN

## 2015-03-11 MED ORDER — ONDANSETRON HCL 4 MG PO TABS: 4.0000 mg | ORAL_TABLET | Freq: Four times a day (QID) | ORAL | Status: DC | PRN

## 2015-03-11 MED ORDER — METRONIDAZOLE IN NACL 5-0.79 MG/ML-% IV SOLN: 500.0000 mg | Freq: Once | INTRAVENOUS | Status: DC

## 2015-03-11 MED ORDER — CIPROFLOXACIN IN D5W 400 MG/200ML IV SOLN
400.0000 mg | Freq: Once | INTRAVENOUS | Status: AC
  Administered 2015-03-11: 400 mg via INTRAVENOUS
  Filled 2015-03-11: qty 200

## 2015-03-11 MED ORDER — ACETAMINOPHEN 325 MG PO TABS
650.0000 mg | ORAL_TABLET | Freq: Four times a day (QID) | ORAL | Status: DC | PRN
  Administered 2015-03-12: 650 mg via ORAL
  Filled 2015-03-11: qty 2

## 2015-03-11 MED ORDER — TRAZODONE HCL 50 MG PO TABS
50.0000 mg | ORAL_TABLET | Freq: Every evening | ORAL | Status: DC | PRN
  Filled 2015-03-11: qty 1

## 2015-03-11 MED ORDER — ESTRADIOL 0.05 MG/24HR TD PTWK: 0.0500 mg | MEDICATED_PATCH | TRANSDERMAL | Status: DC

## 2015-03-11 MED ORDER — ONDANSETRON HCL 4 MG/2ML IJ SOLN
4.0000 mg | Freq: Four times a day (QID) | INTRAMUSCULAR | Status: DC | PRN
  Administered 2015-03-11: 4 mg via INTRAVENOUS
  Filled 2015-03-11: qty 2

## 2015-03-11 MED ORDER — SODIUM CHLORIDE 0.9 % IV BOLUS (SEPSIS)
1000.0000 mL | Freq: Once | INTRAVENOUS | Status: AC
  Administered 2015-03-11: 1000 mL via INTRAVENOUS

## 2015-03-11 MED ORDER — ACETAMINOPHEN 650 MG RE SUPP: 650.0000 mg | Freq: Four times a day (QID) | RECTAL | Status: DC | PRN

## 2015-03-11 MED ORDER — METOPROLOL TARTRATE 12.5 MG HALF TABLET
12.5000 mg | ORAL_TABLET | Freq: Two times a day (BID) | ORAL | Status: DC
  Administered 2015-03-11 – 2015-03-13 (×4): 12.5 mg via ORAL
  Filled 2015-03-11 (×4): qty 1

## 2015-03-11 MED ORDER — SODIUM CHLORIDE 0.9 % IV SOLN
INTRAVENOUS | Status: DC
  Administered 2015-03-11 – 2015-03-12 (×2): via INTRAVENOUS

## 2015-03-11 MED ORDER — ENOXAPARIN SODIUM 30 MG/0.3ML ~~LOC~~ SOLN
30.0000 mg | SUBCUTANEOUS | Status: DC
  Administered 2015-03-11 – 2015-03-12 (×2): 30 mg via SUBCUTANEOUS
  Filled 2015-03-11 (×2): qty 0.3

## 2015-03-11 MED ORDER — CIPROFLOXACIN IN D5W 400 MG/200ML IV SOLN
400.0000 mg | Freq: Two times a day (BID) | INTRAVENOUS | Status: DC
  Administered 2015-03-12 – 2015-03-13 (×3): 400 mg via INTRAVENOUS
  Filled 2015-03-11 (×4): qty 200

## 2015-03-11 MED ORDER — IOHEXOL 300 MG/ML  SOLN
100.0000 mL | Freq: Once | INTRAMUSCULAR | Status: AC | PRN
  Administered 2015-03-11: 100 mL via INTRAVENOUS

## 2015-03-11 NOTE — H&P (Signed)
[facility] History and Physical  Lisa [name] [id number] DOB: October 06, 1970 DOA: 03/11/2015  Referring physician: Regenia Skeeter PCP: Kristine Garbe, MD   Chief Complaint: abdominal pain/distendtion  HPI: Lisa [name] is a 44 y.o. female with a past medical history that includes hypertension, diabetes, and reported irritable bowel syndrome presents to the emergency Department chief complaint of abdominal pain and distention. Initial evaluation in the emergency department reveals colitis/acute diverticulitis, leukocytosis, hypertension, tachycardia.  Patient reports developing sudden left sided abdominal pain this morning when she awakened. He was in her usual state of health up until that point and reports "I was good over Thanksgiving". He does have a history of IBS reportedly and she initially thought this was a flare. Pain described as sharp constant located on the left side of the abdomen persistent and worsening. Associated symptoms include nausea without emesis. She denies fever chills diarrhea dysuria hematuria frequency or urgency. She denies headache visual disturbances syncope or near-syncope. She denies chest pain palpitation shortness of breath diaphoresis. He reports her last BM was 3 days ago it was normal in color and consistency  Workup in the emergency department includes complete blood count significant for WBCs of 17.9, hemoglobin 15.2. Comprehensive metabolic panel significant for serum glucose of 140 otherwise unremarkable, lipase within the limits of normal, urinalysis unremarkable. CT of the abdomen there is inflammatory process in mid left colon with mild thickening of colonic wall and thickening of diverticular wall. Findings are consistent with focal colitis or acute diverticulitis.No definite evidence of perforation. No mesenteric abscess. No hydronephrosis or hydroureter. Moderate stool noted in right colon and transverse colon. No pericecal inflammation. Normal  appendix.  No small bowel obstruction. Status post hysterectomy.  In the emergency department she is afebrile hypertensive with a blood pressure 154/98, tachycardic with a heart rate 100 she is not hypoxic.  In the emergency department she is provided with 400 mg of Cipro intravenously, 500 mg of Flagyl intravenously 1 L of normal saline and 1 mg of Dilaudid.  Review of Systems:  10 point review of systems complete and all systems are negative except as indicated in the history of present illness  Past Medical History  Diagnosis Date  . Diabetes mellitus without complication ([place])   . Hypertension   . IBS (irritable bowel syndrome)   . Diverticulitis    Past Surgical History  Procedure Laterality Date  . Abdominal hysterectomy     Social History:  reports that she has been smoking Cigarettes.  She has a 20 pack-year smoking history. She has never used smokeless tobacco. She reports that she does not drink alcohol or use illicit drugs. She lives at home with her husband. She is employed by [employer]. She does smoke. She is independent with ADL Allergies  Allergen Reactions  . Ambien [Zolpidem]     Hallucinate    Family History  Problem Relation Age of Onset  . COPD Mother   . Congestive Heart Failure Father    she has 4 siblings whose collective medical history is positive for diabetes and heart disease  Prior to Admission medications   Medication Sig Start Date End Date Taking? Authorizing Provider  amLODipine-benazepril (LOTREL) 5-40 MG per capsule Take 1 capsule by mouth daily.   Yes Historical Provider, MD  cholecalciferol (VITAMIN D) 1000 UNITS tablet Take 1,000 Units by mouth daily.   Yes Historical Provider, MD  estradiol (VIVELLE-DOT) 0.075 MG/24HR Place 1 patch onto the skin 2 (two) times a week.   Yes Historical  Provider, MD  Multiple Vitamin (MULTIVITAMIN) tablet Take 1 tablet by mouth daily.   Yes Historical Provider, MD  naproxen sodium (ANAPROX) 220 MG  tablet Take 440 mg by mouth 2 (two) times daily with a meal.   Yes Historical Provider, MD  valsartan-hydrochlorothiazide (DIOVAN-HCT) 320-25 MG per tablet Take 1 tablet by mouth daily.   Yes Historical Provider, MD   Physical Exam: Filed Vitals:   03/11/15 1530 03/11/15 1623 03/11/15 1630 03/11/15 1645  BP: 154/94 139/92 154/98 155/101  Pulse: 88 86 87 87  Temp:      TempSrc:      Resp: 18 18    SpO2: 96% 98% 97% 97%    Wt Readings from Last 3 Encounters:  12/11/12 112.492 kg (248 lb)  07/21/12 112.038 kg (247 lb)  02/13/12 115.667 kg (255 lb)    General:  Appears calm and somewhat uncomfortable Eyes: PERRL, normal lids, irises & conjunctiva ENT: grossly normal hearing, because membranes of her mouth are pink but slightly dry Neck: no LAD, masses or thyromegaly Cardiovascular: RRR, no m/r/g. No LE edema.  Respiratory: CTA bilaterally, no w/r/r. Normal respiratory effort. Abdomen: soft, nondistended. Quite tender on left upper and lower quadrants. No guarding or rebounding. Very sluggish bowel sounds Skin: no rash or induration seen on limited exam Musculoskeletal: grossly normal tone BUE/BLE Psychiatric: grossly normal mood and affect, speech fluent and appropriate Neurologic: grossly non-focal. Speech clear facial symmetry           Labs on Admission:  Basic Metabolic Panel:  Recent Labs Lab 03/11/15 1344  NA 136  K 3.9  CL 102  CO2 26  GLUCOSE 140*  BUN 9  CREATININE 0.84  CALCIUM 9.6   Liver Function Tests:  Recent Labs Lab 03/11/15 1344  AST 18  ALT 21  ALKPHOS 110  BILITOT 0.6  PROT 7.0  ALBUMIN 4.1    Recent Labs Lab 03/11/15 1344  LIPASE 40   No results for input(s): AMMONIA in the last 168 hours. CBC:  Recent Labs Lab 03/11/15 1344  WBC 17.9*  HGB 15.2*  HCT 43.4  MCV 90.8  PLT 331   Cardiac Enzymes: No results for input(s): CKTOTAL, CKMB, CKMBINDEX, TROPONINI in the last 168 hours.  BNP (last 3 results) No results for  input(s): BNP in the last 8760 hours.  ProBNP (last 3 results) No results for input(s): PROBNP in the last 8760 hours.  CBG: No results for input(s): GLUCAP in the last 168 hours.  Radiological Exams on Admission: Ct Abdomen Pelvis W Contrast  03/11/2015  CLINICAL DATA:  Left lower quadrant pain, constipated for 2 day EXAM: CT ABDOMEN AND PELVIS WITH CONTRAST TECHNIQUE: Multidetector CT imaging of the abdomen and pelvis was performed using the standard protocol following bolus administration of intravenous contrast. CONTRAST:  163mL OMNIPAQUE IOHEXOL 300 MG/ML  SOLN COMPARISON:  None. FINDINGS: Lung bases are unremarkable. There are streak artifacts from patient's large body habitus. Sagittal images of the spine unremarkable. No calcified gallstones are noted within gallbladder. Enhanced liver, pancreas, spleen and adrenal glands are unremarkable. Kidneys are symmetrical in size and enhancement. No hydronephrosis or hydroureter. Delayed images shows bilateral renal symmetrical excretion. Bilateral visualized proximal ureter is unremarkable. Moderate stool noted in right colon and transverse colon. There is a redundant proximal transverse colon. No pericecal inflammation. Normal appendix. The terminal ileum is unremarkable. Moderate distended urinary bladder. Colonic diverticula are noted descending colon. In axial image 38 there is inflammatory process in mid left colon with  stranding of pericolonic fat mild thickening of colonic and diverticular wall. Findings are consistent with acute diverticulitis. This is best visualized in coronal image 66. There is no evidence of perforation. No definite mesenteric abscess. The patient is status post hysterectomy. No calcified calculi are noted within urinary bladder. IMPRESSION: 1. There is inflammatory process in mid left colon with mild thickening of colonic wall and thickening of diverticular wall. Findings are consistent with focal colitis or acute  diverticulitis. No definite evidence of perforation. No mesenteric abscess. 2. No hydronephrosis or hydroureter. 3. Moderate stool noted in right colon and transverse colon. No pericecal inflammation. Normal appendix. 4. No small bowel obstruction. 5. Status post hysterectomy. Electronically Signed   By: Lahoma Crocker M.D.   On: 03/11/2015 16:19    EKG:   Assessment/Plan Principal Problem:   Acute diverticulitis Active Problems:   Leukocytosis   Diabetes mellitus without complication (HCC)   Hypertension   IBS (irritable bowel syndrome)   Tachycardia   Tobacco use   Obesity  #1. Acute diverticulitis/colitis. Per CT. History of irritable bowel syndrome reportedly. CT without abscess or perforation -Admit to MedSurg -Provide supportive therapy in the form of nothing by mouth IV fluids anti-emetic and analgesia -Cipro and Flagyl IV -Monitor closely  #2. Hypertension. Home medications include clonidine and metoprolol doses unknown -Patient reports recent change in her medications -We'll provide low-dose metoprolol and clonidine until pharmacy verify dosages -Use when necessary hydralazine as needed -Only fair control  #3. Leukocytosis/tachycardia. Related to #1 -Tachycardia approved by admission -Continue gentle IV fluids -Antibiotics as noted above -Patient is afebrile and nontoxic appearing -CBC in the a.m.  #4. Diabetes. Does not check blood sugar and is not on any medications -We'll obtain hemoglobin A1c -We use sliding scale insulin for optimal control -Will need close outpatient follow-up  #5. Obesity. -Nutritional consult  #6. Tobacco use -Cessation counseling offered  Code Status: full DVT Prophylaxis: Family Communication: none present Disposition Plan: home hopefully 48 hours  Time spent: 44 minutes  [facility]

## 2015-03-11 NOTE — ED Notes (Signed)
Pt here with IBS and left sided abd pain since this morning. Last BM was Wednesday. Reports pain feels worse than her IBS flares.

## 2015-03-11 NOTE — ED Provider Notes (Addendum)
CSN: [id number]     Arrival date & time 03/11/15  1330 History   First MD Initiated Contact with Patient 03/11/15 1408     Chief Complaint  Patient presents with  . Abdominal Pain     (Consider location/radiation/quality/duration/timing/severity/associated sxs/prior Treatment) HPI  44 year old female presents with left-sided abdominal pain that started this morning which first woke up. It seemed to wake her up out of sleep. Before this she had no pain was feeling well. Patient states initially she thought this was like her IBS flares up so she took some of her pain medicine for gas but that did not help. Since then the pain has gradually worsened. It is a sharp pain. Rates her pain as severe. No fevers or vomiting. Has never had pain like this before. No urinary symptoms.  Past Medical History  Diagnosis Date  . Diabetes mellitus without complication ([place])   . Hypertension   . IBS (irritable bowel syndrome)    Past Surgical History  Procedure Laterality Date  . Abdominal hysterectomy     Family History  Problem Relation Age of Onset  . COPD Mother   . Congestive Heart Failure Father    Social History  Substance Use Topics  . Smoking status: Current Every Day Smoker -- 1.00 packs/day for 20 years    Types: Cigarettes  . Smokeless tobacco: Never Used  . Alcohol Use: No   OB History    No data available     Review of Systems  Constitutional: Negative for fever.  Gastrointestinal: Positive for abdominal pain and constipation. Negative for nausea and vomiting.  Genitourinary: Negative for dysuria and hematuria.  Musculoskeletal: Negative for back pain.  All other systems reviewed and are negative.     Allergies  Ambien  Home Medications   Prior to Admission medications   Medication Sig Start Date End Date Taking? Authorizing Provider  amLODipine-benazepril (LOTREL) 5-40 MG per capsule Take 1 capsule by mouth daily.   Yes Historical Provider, MD  cholecalciferol  (VITAMIN D) 1000 UNITS tablet Take 1,000 Units by mouth daily.   Yes Historical Provider, MD  estradiol (VIVELLE-DOT) 0.075 MG/24HR Place 1 patch onto the skin 2 (two) times a week.   Yes Historical Provider, MD  Multiple Vitamin (MULTIVITAMIN) tablet Take 1 tablet by mouth daily.   Yes Historical Provider, MD  naproxen sodium (ANAPROX) 220 MG tablet Take 440 mg by mouth 2 (two) times daily with a meal.   Yes Historical Provider, MD  valsartan-hydrochlorothiazide (DIOVAN-HCT) 320-25 MG per tablet Take 1 tablet by mouth daily.   Yes Historical Provider, MD   BP 145/110 mmHg  Pulse 94  Temp(Src) 99.4 F (37.4 C) (Oral)  Resp 18  SpO2 98%  LMP 01/28/2012 Physical Exam  Constitutional: She is oriented to person, place, and time. She appears well-developed and well-nourished.  HENT:  Head: Normocephalic and atraumatic.  Right Ear: External ear normal.  Left Ear: External ear normal.  Nose: Nose normal.  Eyes: Right eye exhibits no discharge. Left eye exhibits no discharge.  Cardiovascular: Normal rate, regular rhythm and normal heart sounds.   Pulmonary/Chest: Effort normal and breath sounds normal.  Abdominal: Soft. There is tenderness in the left upper quadrant and left lower quadrant. There is no rigidity.  Neurological: She is alert and oriented to person, place, and time.  Skin: Skin is warm and dry.  Nursing note and vitals reviewed.   ED Course  Procedures (including critical care time) Labs Review Labs Reviewed  COMPREHENSIVE  METABOLIC PANEL - Abnormal; Notable for the following:    Glucose, Bld 140 (*)    All other components within normal limits  CBC - Abnormal; Notable for the following:    WBC 17.9 (*)    Hemoglobin 15.2 (*)    All other components within normal limits  URINALYSIS, ROUTINE W REFLEX MICROSCOPIC (NOT AT [facility]) - Abnormal; Notable for the following:    APPearance CLOUDY (*)    Hgb urine dipstick LARGE (*)    All other components within normal limits   URINE MICROSCOPIC-ADD ON - Abnormal; Notable for the following:    Squamous Epithelial / LPF 0-5 (*)    All other components within normal limits  LIPASE, BLOOD    Imaging Review Ct Abdomen Pelvis W Contrast  03/11/2015  CLINICAL DATA:  Left lower quadrant pain, constipated for 2 day EXAM: CT ABDOMEN AND PELVIS WITH CONTRAST TECHNIQUE: Multidetector CT imaging of the abdomen and pelvis was performed using the standard protocol following bolus administration of intravenous contrast. CONTRAST:  194mL OMNIPAQUE IOHEXOL 300 MG/ML  SOLN COMPARISON:  None. FINDINGS: Lung bases are unremarkable. There are streak artifacts from patient's large body habitus. Sagittal images of the spine unremarkable. No calcified gallstones are noted within gallbladder. Enhanced liver, pancreas, spleen and adrenal glands are unremarkable. Kidneys are symmetrical in size and enhancement. No hydronephrosis or hydroureter. Delayed images shows bilateral renal symmetrical excretion. Bilateral visualized proximal ureter is unremarkable. Moderate stool noted in right colon and transverse colon. There is a redundant proximal transverse colon. No pericecal inflammation. Normal appendix. The terminal ileum is unremarkable. Moderate distended urinary bladder. Colonic diverticula are noted descending colon. In axial image 38 there is inflammatory process in mid left colon with stranding of pericolonic fat mild thickening of colonic and diverticular wall. Findings are consistent with acute diverticulitis. This is best visualized in coronal image 66. There is no evidence of perforation. No definite mesenteric abscess. The patient is status post hysterectomy. No calcified calculi are noted within urinary bladder. IMPRESSION: 1. There is inflammatory process in mid left colon with mild thickening of colonic wall and thickening of diverticular wall. Findings are consistent with focal colitis or acute diverticulitis. No definite evidence of  perforation. No mesenteric abscess. 2. No hydronephrosis or hydroureter. 3. Moderate stool noted in right colon and transverse colon. No pericecal inflammation. Normal appendix. 4. No small bowel obstruction. 5. Status post hysterectomy. Electronically Signed   By: Lahoma Crocker M.D.   On: 03/11/2015 16:19   I have personally reviewed and evaluated these images and lab results as part of my medical decision-making.   EKG Interpretation None      MDM   Final diagnoses:  Acute diverticulitis    Patient's acute left-sided abdominal pain will be evaluated with a CT scan. No peritonitis. Patient is comparable after IV pain medicine. Given her elevated white blood cell count with low-grade fever, concern for an infectious source. Care transferred to Dr. Dayna Barker with CT pending.    Sherwood Gambler, MD 03/11/15 1619  Patient's CT shows acute diverticulitis. No perforation but patient remains in significant pain, has WBC of almost 18 and has diabetes. Plan to admit for IV antibiotics.   Sherwood Gambler, MD 03/11/15 [phone]

## 2015-03-12 DIAGNOSIS — K5792 Diverticulitis of intestine, part unspecified, without perforation or abscess without bleeding: Secondary | ICD-10-CM

## 2015-03-12 LAB — GLUCOSE, CAPILLARY
GLUCOSE-CAPILLARY: 100 mg/dL — AB (ref 65–99)
GLUCOSE-CAPILLARY: 117 mg/dL — AB (ref 65–99)
GLUCOSE-CAPILLARY: 98 mg/dL (ref 65–99)
Glucose-Capillary: 80 mg/dL (ref 65–99)

## 2015-03-12 LAB — CBC
HCT: 38.7 % (ref 36.0–46.0)
HEMOGLOBIN: 12.9 g/dL (ref 12.0–15.0)
MCH: 30.5 pg (ref 26.0–34.0)
MCHC: 33.3 g/dL (ref 30.0–36.0)
MCV: 91.5 fL (ref 78.0–100.0)
Platelets: 292 10*3/uL (ref 150–400)
RBC: 4.23 MIL/uL (ref 3.87–5.11)
RDW: 13.9 % (ref 11.5–15.5)
WBC: 12 10*3/uL — ABNORMAL HIGH (ref 4.0–10.5)

## 2015-03-12 LAB — BASIC METABOLIC PANEL
ANION GAP: 10 (ref 5–15)
BUN: 8 mg/dL (ref 6–20)
CALCIUM: 9.2 mg/dL (ref 8.9–10.3)
CO2: 25 mmol/L (ref 22–32)
Chloride: 105 mmol/L (ref 101–111)
Creatinine, Ser: 0.8 mg/dL (ref 0.44–1.00)
Glucose, Bld: 113 mg/dL — ABNORMAL HIGH (ref 65–99)
POTASSIUM: 4 mmol/L (ref 3.5–5.1)
Sodium: 140 mmol/L (ref 135–145)

## 2015-03-12 MED ORDER — SODIUM CHLORIDE 0.9 % IV SOLN
INTRAVENOUS | Status: AC
  Administered 2015-03-12: 20:00:00 via INTRAVENOUS

## 2015-03-12 MED ORDER — HYDROCODONE-ACETAMINOPHEN 5-325 MG PO TABS: 1.0000 | ORAL_TABLET | ORAL | Status: DC | PRN

## 2015-03-12 MED ORDER — MORPHINE SULFATE (PF) 2 MG/ML IV SOLN: 1.0000 mg | INTRAVENOUS | Status: DC | PRN

## 2015-03-12 NOTE — Progress Notes (Signed)
Patient Demographics:    Lisa [name], is a 44 y.o. female, DOB - 01/15/1971, [id number]  Admit date - 03/11/2015   Admitting Physician Elmarie Shiley, MD  Outpatient Primary MD for the patient is Kristine Garbe, MD  LOS - 1   Chief Complaint  Patient presents with  . Abdominal Pain        Subjective:    Lisa [name] today has, No headache, No chest pain, which improved left lower quadrant abdominal pain - No Nausea, No new weakness tingling or numbness, No Cough - SOB.     Assessment  & Plan :     1. Sepsis due to acute left-sided diverticulitis. She feels much better, clinically improved, will advance diet to clear liquids, continue empiric antibiotics, if stable will discharge in the morning with outpatient GI follow-up. Sepsis clinically resolved.  2. Essential hypertension. Continue on home dose clonidine and beta blocker. Stable.  3. Morbid obesity. Follow with PCP outpatient post discharge.  4. History of smoking. Counseled to quit.  5. DM type II. Continue sliding scale for now.  No results found for: HGBA1C  CBG (last 3)   Recent Labs  03/11/15 1803 03/11/15 2123 03/12/15 0757  GLUCAP 121* 106* 100*      Code Status : Full  Family Communication  : None present  Disposition Plan  : Home in 1-2 days  Consults  :  None  Procedures  : CT abdomen and pelvis showing left-sided diverticulitis.  DVT Prophylaxis  :  Lovenox    Lab Results  Component Value Date   PLT 292 03/12/2015    Inpatient Medications  Scheduled Meds: . amLODipine  5 mg Oral Daily  . ciprofloxacin  400 mg Intravenous Q12H  . enoxaparin (LOVENOX) injection  30 mg Subcutaneous Q24H  . [START ON 03/13/2015] estradiol  0.1 mg Transdermal Weekly  . insulin aspart  0-5 Units Subcutaneous  QHS  . insulin aspart  0-9 Units Subcutaneous TID WC  . metoprolol tartrate  12.5 mg Oral BID  . metronidazole  500 mg Intravenous Q8H   Continuous Infusions: . sodium chloride     PRN Meds:.acetaminophen **OR** [DISCONTINUED] acetaminophen, hydrALAZINE, HYDROcodone-acetaminophen, morphine injection, [DISCONTINUED] ondansetron **OR** ondansetron (ZOFRAN) IV, traZODone  Antibiotics  :     Anti-infectives    Start     Dose/Rate Route Frequency Ordered Stop   03/12/15 0600  ciprofloxacin (CIPRO) IVPB 400 mg     400 mg 200 mL/hr over 60 Minutes Intravenous Every 12 hours 03/11/15 1732     03/11/15 1800  metroNIDAZOLE (FLAGYL) IVPB 500 mg     500 mg 100 mL/hr over 60 Minutes Intravenous Every 8 hours 03/11/15 1732     03/11/15 1645  ciprofloxacin (CIPRO) IVPB 400 mg     400 mg 200 mL/hr over 60 Minutes Intravenous  Once 03/11/15 1633 03/11/15 1810   03/11/15 1645  metroNIDAZOLE (FLAGYL) IVPB 500 mg  Status:  Discontinued     500 mg 100 mL/hr over 60 Minutes Intravenous  Once 03/11/15 1633 03/11/15 1735        Objective:   Filed Vitals:   03/11/15 1645 03/11/15 1740 03/11/15 2124 03/12/15 0215  BP: 155/101 143/89 129/74 107/72  Pulse: 87 85 80 73  Temp:  98.1 F (36.7 C) 97.7 F (36.5 C) 98.4 F (36.9 C)  TempSrc:  Oral Oral Oral  Resp:  16 19 18   SpO2: 97% 96% 97% 95%    Wt Readings from Last 3 Encounters:  12/11/12 112.492 kg (248 lb)  07/21/12 112.038 kg (247 lb)  02/13/12 115.667 kg (255 lb)     Intake/Output Summary (Last 24 hours) at 03/12/15 1122 Last data filed at 03/12/15 1100  Gross per 24 hour  Intake   1410 ml  Output      0 ml  Net   1410 ml     Physical Exam  Awake Alert, Oriented X 3, No new F.N deficits, Normal affect [place].AT,PERRAL Supple Neck,No JVD, No cervical lymphadenopathy appriciated.  Symmetrical Chest wall movement, Good air movement bilaterally, CTAB RRR,No Gallops,Rubs or new Murmurs, No Parasternal Heave +ve B.Sounds, Abd Soft,  minimal LLQ tenderness, No organomegaly appriciated, No rebound - guarding or rigidity. No Cyanosis, Clubbing or edema, No new Rash or bruise      Data Review:   Micro Results No results found for this or any previous visit (from the past 240 hour(s)).  Radiology Reports Ct Abdomen Pelvis W Contrast  03/11/2015  CLINICAL DATA:  Left lower quadrant pain, constipated for 2 day EXAM: CT ABDOMEN AND PELVIS WITH CONTRAST TECHNIQUE: Multidetector CT imaging of the abdomen and pelvis was performed using the standard protocol following bolus administration of intravenous contrast. CONTRAST:  169mL OMNIPAQUE IOHEXOL 300 MG/ML  SOLN COMPARISON:  None. FINDINGS: Lung bases are unremarkable. There are streak artifacts from patient's large body habitus. Sagittal images of the spine unremarkable. No calcified gallstones are noted within gallbladder. Enhanced liver, pancreas, spleen and adrenal glands are unremarkable. Kidneys are symmetrical in size and enhancement. No hydronephrosis or hydroureter. Delayed images shows bilateral renal symmetrical excretion. Bilateral visualized proximal ureter is unremarkable. Moderate stool noted in right colon and transverse colon. There is a redundant proximal transverse colon. No pericecal inflammation. Normal appendix. The terminal ileum is unremarkable. Moderate distended urinary bladder. Colonic diverticula are noted descending colon. In axial image 38 there is inflammatory process in mid left colon with stranding of pericolonic fat mild thickening of colonic and diverticular wall. Findings are consistent with acute diverticulitis. This is best visualized in coronal image 66. There is no evidence of perforation. No definite mesenteric abscess. The patient is status post hysterectomy. No calcified calculi are noted within urinary bladder. IMPRESSION: 1. There is inflammatory process in mid left colon with mild thickening of colonic wall and thickening of diverticular wall.  Findings are consistent with focal colitis or acute diverticulitis. No definite evidence of perforation. No mesenteric abscess. 2. No hydronephrosis or hydroureter. 3. Moderate stool noted in right colon and transverse colon. No pericecal inflammation. Normal appendix. 4. No small bowel obstruction. 5. Status post hysterectomy. Electronically Signed   By: Lahoma Crocker M.D.   On: 03/11/2015 16:19     CBC  Recent Labs Lab 03/11/15 1344 03/12/15 0425  WBC 17.9* 12.0*  HGB 15.2* 12.9  HCT 43.4 38.7  PLT 331 292  MCV 90.8 91.5  MCH 31.8 30.5  MCHC 35.0 33.3  RDW 13.5 13.9    Chemistries   Recent Labs Lab 03/11/15 1344 03/12/15 0425  NA 136 140  K 3.9 4.0  CL 102 105  CO2 26 25  GLUCOSE 140* 113*  BUN 9 8  CREATININE 0.84 0.80  CALCIUM 9.6 9.2  AST 18  --   ALT 21  --  ALKPHOS 110  --   BILITOT 0.6  --    ------------------------------------------------------------------------------------------------------------------ CrCl cannot be calculated (Unknown ideal weight.). ------------------------------------------------------------------------------------------------------------------ No results for input(s): HGBA1C in the last 72 hours. ------------------------------------------------------------------------------------------------------------------ No results for input(s): CHOL, HDL, LDLCALC, TRIG, CHOLHDL, LDLDIRECT in the last 72 hours. ------------------------------------------------------------------------------------------------------------------  Recent Labs  03/11/15 1834  TSH 2.335   ------------------------------------------------------------------------------------------------------------------ No results for input(s): VITAMINB12, FOLATE, FERRITIN, TIBC, IRON, RETICCTPCT in the last 72 hours.  Coagulation profile No results for input(s): INR, PROTIME in the last 168 hours.  No results for input(s): DDIMER in the last 72 hours.  Cardiac Enzymes No results  for input(s): CKMB, TROPONINI, MYOGLOBIN in the last 168 hours.  Invalid input(s): CK ------------------------------------------------------------------------------------------------------------------ Invalid input(s): POCBNP   Time Spent in minutes 35   Lala Lund K M.D on 03/12/2015 at 11:22 AM  Between 7am to 7pm - Pager - [phone]  After 7pm go to [email or web address] - password [facility]  [facility] -  Office  [phone]

## 2015-03-13 ENCOUNTER — Encounter: Payer: Self-pay | Admitting: Physician Assistant

## 2015-03-13 LAB — HEMOGLOBIN A1C
HEMOGLOBIN A1C: 6.7 % — AB (ref 4.8–5.6)
Hgb A1c MFr Bld: 6.7 % — ABNORMAL HIGH (ref 4.8–5.6)
Mean Plasma Glucose: 146 mg/dL
Mean Plasma Glucose: 146 mg/dL

## 2015-03-13 LAB — GLUCOSE, CAPILLARY: Glucose-Capillary: 109 mg/dL — ABNORMAL HIGH (ref 65–99)

## 2015-03-13 MED ORDER — CIPROFLOXACIN HCL 500 MG PO TABS: 500.0000 mg | ORAL_TABLET | Freq: Two times a day (BID) | ORAL | Status: DC

## 2015-03-13 MED ORDER — DOCUSATE SODIUM 100 MG PO CAPS: 100.0000 mg | ORAL_CAPSULE | Freq: Two times a day (BID) | ORAL | Status: DC | PRN

## 2015-03-13 MED ORDER — METRONIDAZOLE 500 MG PO TABS: 500.0000 mg | ORAL_TABLET | Freq: Three times a day (TID) | ORAL | Status: DC

## 2015-03-13 MED ORDER — HYDROCODONE-ACETAMINOPHEN 5-325 MG PO TABS: 1.0000 | ORAL_TABLET | Freq: Four times a day (QID) | ORAL | Status: DC | PRN

## 2015-03-13 NOTE — Discharge Summary (Signed)
Lisa [name], is a 44 y.o. female  DOB December 25, 1970  MRN [id number].  Admission date:  03/11/2015  Admitting Physician  Elmarie Shiley, MD  Discharge Date:  03/13/2015   Primary MD  Kristine Garbe, MD  Recommendations for primary care physician for things to follow:   Check CBC, BMP in 3-4 days. Needs outpatient GI follow-up for colonoscopy in the next one month.   Admission Diagnosis  Diabetes mellitus without complication (HCC) A999333 IBS (irritable bowel syndrome) [K58.9] Essential hypertension [I10] Acute diverticulitis [K57.92]   Discharge Diagnosis  Diabetes mellitus without complication ([place]) A999333 IBS (irritable bowel syndrome) [K58.9] Essential hypertension [I10] Acute diverticulitis [K57.92]     Principal Problem:   Acute diverticulitis Active Problems:   Leukocytosis   Diabetes mellitus without complication (HCC)   Hypertension   IBS (irritable bowel syndrome)   Tachycardia   Tobacco use   Obesity   Diverticulitis      Past Medical History  Diagnosis Date  . Diabetes mellitus without complication ([place])   . Hypertension   . IBS (irritable bowel syndrome)   . Diverticulitis     Past Surgical History  Procedure Laterality Date  . Abdominal hysterectomy         HPI  from the history and physical done on the day of admission:    Lisa [name] is a 44 y.o. female with a past medical history that includes hypertension, diabetes, and reported irritable bowel syndrome presents to the emergency Department chief complaint of abdominal pain and distention. Initial evaluation in the emergency department reveals colitis/acute diverticulitis, leukocytosis, hypertension, tachycardia.  Patient reports developing sudden left sided abdominal pain this morning when she awakened. He was in her  usual state of health up until that point and reports "I was good over Thanksgiving". He does have a history of IBS reportedly and she initially thought this was a flare. Pain described as sharp constant located on the left side of the abdomen persistent and worsening. Associated symptoms include nausea without emesis. She denies fever chills diarrhea dysuria hematuria frequency or urgency. She denies headache visual disturbances syncope or near-syncope. She denies chest pain palpitation shortness of breath diaphoresis. He reports her last BM was 3 days ago it was normal in color and consistency  Workup in the emergency department includes complete blood count significant for WBCs of 17.9, hemoglobin 15.2. Comprehensive metabolic panel significant for serum glucose of 140 otherwise unremarkable, lipase within the limits of normal, urinalysis unremarkable. CT of the abdomen there is inflammatory process in mid left colon with mild thickening of colonic wall and thickening of diverticular wall. Findings are consistent with focal colitis or acute diverticulitis.No definite evidence of perforation. No mesenteric abscess. No hydronephrosis or hydroureter. Moderate stool noted in right colon and transverse colon. No pericecal inflammation. Normal appendix. No small bowel obstruction. Status post hysterectomy.  In the emergency department she is afebrile hypertensive with a blood pressure 154/98, tachycardic with a heart rate 100 she is not hypoxic.  In the emergency department she  is provided with 400 mg of Cipro intravenously, 500 mg of Flagyl intravenously 1 L of normal saline and 1 mg of Dilaudid.     Hospital Course:     1. Sepsis due to acute left-sided diverticulitis. She feels much better, clinically improved and almost completely symptom-free, tolerated clear liquid diet for 24 hours, was on IV Cipro Flagyl and will be switched to oral Cipro and Flagyl for another 12 days. Will need outpatient GI  follow-up for colonoscopy within a month. Request PCP to arrange for the same.  2. Essential hypertension. Continue on home dose clonidine and beta blocker. Stable.  3. Morbid obesity. Follow with PCP outpatient post discharge.  4. History of smoking. Counseled to quit.  5. DM type II. Continue home Rx.    Discharge Condition: Stable  Follow UP  Follow-up Information    Follow up with REESE,BETTI D, MD. Schedule an appointment as soon as possible for a visit in 1 week.   Specialty:  Family Medicine   Contact information:   [id number] [place] [address] [phone]       Follow up with Silvano Rusk, MD. Schedule an appointment as soon as possible for a visit in 3 weeks.   Specialty:  Gastroenterology   Why:  Diverticulitis   Contact information:   [address] [address] [phone]        Consults obtained - None  Diet and Activity recommendation: See Discharge Instructions below  Discharge Instructions         Discharge Instructions    Discharge instructions    Complete by:  As directed   Follow with Primary MD REESE,BETTI D, MD in 7 days   Get CBC, CMP, 2 view Chest X ray checked  by Primary MD next visit.    Activity: As tolerated with Full fall precautions use walker/cane & assistance as needed   Disposition Home    Diet: Soft diet for 4-5 days then advance gradually to regular consistency.  For Heart failure patients - Check your Weight same time everyday, if you gain over 2 pounds, or you develop in leg swelling, experience more shortness of breath or chest pain, call your Primary MD immediately. Follow Cardiac Low Salt Diet and 1.5 lit/day fluid restriction.   On your next visit with your primary care physician please Get Medicines reviewed and adjusted.   Please request your Prim.MD to go over all Hospital Tests and Procedure/Radiological results at the follow up, please get all Hospital records sent to  your Prim MD by signing hospital release before you go home.   If you experience worsening of your admission symptoms, develop shortness of breath, life threatening emergency, suicidal or homicidal thoughts you must seek medical attention immediately by calling 911 or calling your MD immediately  if symptoms less severe.  You Must read complete instructions/literature along with all the possible adverse reactions/side effects for all the Medicines you take and that have been prescribed to you. Take any new Medicines after you have completely understood and accpet all the possible adverse reactions/side effects.   Do not drive, operating heavy machinery, perform activities at heights, swimming or participation in water activities or provide baby sitting services if your were admitted for syncope or siezures until you have seen by Primary MD or a Neurologist and advised to do so again.  Do not drive when taking Pain medications.    Do not take more than prescribed Pain, Sleep and  Anxiety Medications  Special Instructions: If you have smoked or chewed Tobacco  in the last 2 yrs please stop smoking, stop any regular Alcohol  and or any Recreational drug use.  Wear Seat belts while driving.   Please note  You were cared for by a hospitalist during your hospital stay. If you have any questions about your discharge medications or the care you received while you were in the hospital after you are discharged, you can call the unit and asked to speak with the hospitalist on call if the hospitalist that took care of you is not available. Once you are discharged, your primary care physician will handle any further medical issues. Please note that NO REFILLS for any discharge medications will be authorized once you are discharged, as it is imperative that you return to your primary care physician (or establish a relationship with a primary care physician if you do not have one) for your aftercare needs so  that they can reassess your need for medications and monitor your lab values.     Increase activity slowly    Complete by:  As directed              Discharge Medications       Medication List    TAKE these medications        amLODipine-benazepril 5-40 MG capsule  Commonly known as:  LOTREL  Take 1 capsule by mouth daily.     cholecalciferol 1000 UNITS tablet  Commonly known as:  VITAMIN D  Take 1,000 Units by mouth daily.     ciprofloxacin 500 MG tablet  Commonly known as:  CIPRO  Take 1 tablet (500 mg total) by mouth 2 (two) times daily.     docusate sodium 100 MG capsule  Commonly known as:  COLACE  Take 1 capsule (100 mg total) by mouth 2 (two) times daily as needed for mild constipation.     estradiol 0.075 MG/24HR  Commonly known as:  VIVELLE-DOT  Place 1 patch onto the skin 2 (two) times a week.     HYDROcodone-acetaminophen 5-325 MG tablet  Commonly known as:  NORCO/VICODIN  Take 1 tablet by mouth every 6 (six) hours as needed for moderate pain.     metroNIDAZOLE 500 MG tablet  Commonly known as:  FLAGYL  Take 1 tablet (500 mg total) by mouth 3 (three) times daily.     multivitamin tablet  Take 1 tablet by mouth daily.     naproxen sodium 220 MG tablet  Commonly known as:  ANAPROX  Take 440 mg by mouth 2 (two) times daily with a meal.     valsartan-hydrochlorothiazide 320-25 MG tablet  Commonly known as:  DIOVAN-HCT  Take 1 tablet by mouth daily.        Major procedures and Radiology Reports - PLEASE review detailed and final reports for all details, in brief -     Ct Abdomen Pelvis W Contrast  03/11/2015  CLINICAL DATA:  Left lower quadrant pain, constipated for 2 day EXAM: CT ABDOMEN AND PELVIS WITH CONTRAST TECHNIQUE: Multidetector CT imaging of the abdomen and pelvis was performed using the standard protocol following bolus administration of intravenous contrast. CONTRAST:  178mL OMNIPAQUE IOHEXOL 300 MG/ML  SOLN COMPARISON:  None.  FINDINGS: Lung bases are unremarkable. There are streak artifacts from patient's large body habitus. Sagittal images of the spine unremarkable. No calcified gallstones are noted within gallbladder. Enhanced liver, pancreas, spleen and adrenal glands are unremarkable. Kidneys are symmetrical  in size and enhancement. No hydronephrosis or hydroureter. Delayed images shows bilateral renal symmetrical excretion. Bilateral visualized proximal ureter is unremarkable. Moderate stool noted in right colon and transverse colon. There is a redundant proximal transverse colon. No pericecal inflammation. Normal appendix. The terminal ileum is unremarkable. Moderate distended urinary bladder. Colonic diverticula are noted descending colon. In axial image 38 there is inflammatory process in mid left colon with stranding of pericolonic fat mild thickening of colonic and diverticular wall. Findings are consistent with acute diverticulitis. This is best visualized in coronal image 66. There is no evidence of perforation. No definite mesenteric abscess. The patient is status post hysterectomy. No calcified calculi are noted within urinary bladder. IMPRESSION: 1. There is inflammatory process in mid left colon with mild thickening of colonic wall and thickening of diverticular wall. Findings are consistent with focal colitis or acute diverticulitis. No definite evidence of perforation. No mesenteric abscess. 2. No hydronephrosis or hydroureter. 3. Moderate stool noted in right colon and transverse colon. No pericecal inflammation. Normal appendix. 4. No small bowel obstruction. 5. Status post hysterectomy. Electronically Signed   By: Lahoma Crocker M.D.   On: 03/11/2015 16:19    Micro Results    No results found for this or any previous visit (from the past 240 hour(s)).     Today   Subjective    Lisa [name] today has no headache,no chest abdominal pain,no new weakness tingling or numbness, feels much better wants to go  home today.    Objective   Blood pressure 141/65, pulse 80, temperature 98 F (36.7 C), temperature source Oral, resp. rate 16, last menstrual period 01/28/2012, SpO2 97 %.   Intake/Output Summary (Last 24 hours) at 03/13/15 0923 Last data filed at 03/13/15 0645  Gross per 24 hour  Intake 2221.25 ml  Output   1000 ml  Net 1221.25 ml    Exam Awake Alert, Oriented x 3, No new F.N deficits, Normal affect [place].AT,PERRAL Supple Neck,No JVD, No cervical lymphadenopathy appriciated.  Symmetrical Chest wall movement, Good air movement bilaterally, CTAB RRR,No Gallops,Rubs or new Murmurs, No Parasternal Heave +ve B.Sounds, Abd Soft, Non tender, No organomegaly appriciated, No rebound -guarding or rigidity. No Cyanosis, Clubbing or edema, No new Rash or bruise   Data Review   CBC w Diff:  Lab Results  Component Value Date   WBC 12.0* 03/12/2015   HGB 12.9 03/12/2015   HCT 38.7 03/12/2015   PLT 292 03/12/2015   LYMPHOPCT 38 12/11/2012   MONOPCT 4 12/11/2012   EOSPCT 2 12/11/2012   BASOPCT 0 12/11/2012    CMP:  Lab Results  Component Value Date   NA 140 03/12/2015   K 4.0 03/12/2015   CL 105 03/12/2015   CO2 25 03/12/2015   BUN 8 03/12/2015   CREATININE 0.80 03/12/2015   PROT 7.0 03/11/2015   ALBUMIN 4.1 03/11/2015   BILITOT 0.6 03/11/2015   ALKPHOS 110 03/11/2015   AST 18 03/11/2015   ALT 21 03/11/2015  .   Total Time in preparing paper work, data evaluation and todays exam - 35 minutes  Thurnell Lose M.D on 03/13/2015 at 9:23 AM  [facility]   Office  [phone]

## 2015-03-13 NOTE — Plan of Care (Signed)
     Lisa [name] was admitted to the Hospital on 03/11/2015 and Discharged  03/13/2015 and should be excused from work/school   for 4 days starting 03/11/2015 , may return to work/school without any restrictions.  Call Lala Lund MD, [facility]  [phone] with questions.  Thurnell Lose M.D on 03/13/2015,at 8:18 AM  [facility]   Office  [phone]

## 2015-03-13 NOTE — Care Management Note (Signed)
Case Management Note  Patient Details  Name: Lisa [name] MRN: [id number] Date of Birth: 21-Jun-1970  Subjective/Objective:                    Action/Plan:  Initial UR completed  Expected Discharge Date:                  Expected Discharge Plan:  Home/Self Care  In-House Referral:     Discharge planning Services     Post Acute Care Choice:    Choice offered to:     DME Arranged:    DME Agency:     HH Arranged:    [place] Agency:     Status of Service:  Completed, signed off  Medicare Important Message Given:    Date Medicare IM Given:    Medicare IM give by:    Date Additional Medicare IM Given:    Additional Medicare Important Message give by:     If discussed at [place] of Stay Meetings, dates discussed:    Additional Comments:  Marilu Favre, RN 03/13/2015, 8:55 AM

## 2015-03-13 NOTE — Discharge Instructions (Signed)
Follow with Primary MD REESE,BETTI D, MD in 7 days   Get CBC, CMP, 2 view Chest X ray checked  by Primary MD next visit.    Activity: As tolerated with Full fall precautions use walker/cane & assistance as needed   Disposition Home    Diet: Soft diet for 4-5 days then advance gradually to regular consistency.  For Heart failure patients - Check your Weight same time everyday, if you gain over 2 pounds, or you develop in leg swelling, experience more shortness of breath or chest pain, call your Primary MD immediately. Follow Cardiac Low Salt Diet and 1.5 lit/day fluid restriction.   On your next visit with your primary care physician please Get Medicines reviewed and adjusted.   Please request your Prim.MD to go over all Hospital Tests and Procedure/Radiological results at the follow up, please get all Hospital records sent to your Prim MD by signing hospital release before you go home.   If you experience worsening of your admission symptoms, develop shortness of breath, life threatening emergency, suicidal or homicidal thoughts you must seek medical attention immediately by calling 911 or calling your MD immediately  if symptoms less severe.  You Must read complete instructions/literature along with all the possible adverse reactions/side effects for all the Medicines you take and that have been prescribed to you. Take any new Medicines after you have completely understood and accpet all the possible adverse reactions/side effects.   Do not drive, operating heavy machinery, perform activities at heights, swimming or participation in water activities or provide baby sitting services if your were admitted for syncope or siezures until you have seen by Primary MD or a Neurologist and advised to do so again.  Do not drive when taking Pain medications.    Do not take more than prescribed Pain, Sleep and Anxiety Medications  Special Instructions: If you have smoked or chewed Tobacco  in  the last 2 yrs please stop smoking, stop any regular Alcohol  and or any Recreational drug use.  Wear Seat belts while driving.   Please note  You were cared for by a hospitalist during your hospital stay. If you have any questions about your discharge medications or the care you received while you were in the hospital after you are discharged, you can call the unit and asked to speak with the hospitalist on call if the hospitalist that took care of you is not available. Once you are discharged, your primary care physician will handle any further medical issues. Please note that NO REFILLS for any discharge medications will be authorized once you are discharged, as it is imperative that you return to your primary care physician (or establish a relationship with a primary care physician if you do not have one) for your aftercare needs so that they can reassess your need for medications and monitor your lab values.

## 2015-03-13 NOTE — Progress Notes (Signed)
Colon Flattery to be D/C'd home per MD order. Discussed with the patient and all questions fully answered.  VSS, Skin clean, dry and intact without evidence of skin break down, no evidence of skin tears noted.  IV catheter discontinued intact. Site without signs and symptoms of complications. Dressing and pressure applied.  An After Visit Summary was printed and given to the patient. Patient received prescriptions and work note. Follow up appointments made with patient's primary care physician and Gastroenterologist.  D/c education completed with patient/family including follow up instructions, medication list, d/c activities limitations if indicated, with other d/c instructions as indicated by MD - patient able to verbalize understanding, all questions fully answered.   Patient instructed to return to ED, call 911, or call MD for any changes in condition.   Patient to be escorted via [place], and D/C home via private auto.

## 2015-03-30 ENCOUNTER — Encounter: Payer: Self-pay | Admitting: Physician Assistant

## 2015-03-30 ENCOUNTER — Ambulatory Visit (INDEPENDENT_AMBULATORY_CARE_PROVIDER_SITE_OTHER): Payer: 59 | Admitting: Physician Assistant

## 2015-03-30 ENCOUNTER — Other Ambulatory Visit (INDEPENDENT_AMBULATORY_CARE_PROVIDER_SITE_OTHER): Payer: 59

## 2015-03-30 VITALS — BP 136/96 | HR 88 | Ht 68.0 in | Wt 263.4 lb

## 2015-03-30 DIAGNOSIS — K5909 Other constipation: Secondary | ICD-10-CM | POA: Diagnosis not present

## 2015-03-30 DIAGNOSIS — K5732 Diverticulitis of large intestine without perforation or abscess without bleeding: Secondary | ICD-10-CM | POA: Diagnosis not present

## 2015-03-30 DIAGNOSIS — R1032 Left lower quadrant pain: Secondary | ICD-10-CM

## 2015-03-30 LAB — CBC WITH DIFFERENTIAL/PLATELET
BASOS ABS: 0.1 10*3/uL (ref 0.0–0.1)
Basophils Relative: 0.7 % (ref 0.0–3.0)
EOS ABS: 0.3 10*3/uL (ref 0.0–0.7)
Eosinophils Relative: 2.4 % (ref 0.0–5.0)
HEMATOCRIT: 43.9 % (ref 36.0–46.0)
HEMOGLOBIN: 14.9 g/dL (ref 12.0–15.0)
LYMPHS PCT: 26.6 % (ref 12.0–46.0)
Lymphs Abs: 3.8 10*3/uL (ref 0.7–4.0)
MCHC: 34 g/dL (ref 30.0–36.0)
MCV: 90.4 fl (ref 78.0–100.0)
MONO ABS: 0.7 10*3/uL (ref 0.1–1.0)
Monocytes Relative: 4.9 % (ref 3.0–12.0)
Neutro Abs: 9.3 10*3/uL — ABNORMAL HIGH (ref 1.4–7.7)
Neutrophils Relative %: 65.4 % (ref 43.0–77.0)
PLATELETS: 377 10*3/uL (ref 150.0–400.0)
RBC: 4.86 Mil/uL (ref 3.87–5.11)
RDW: 13.7 % (ref 11.5–15.5)
WBC: 14.3 10*3/uL — AB (ref 4.0–10.5)

## 2015-03-30 MED ORDER — METRONIDAZOLE 500 MG PO TABS: ORAL_TABLET | ORAL | Status: DC

## 2015-03-30 MED ORDER — CIPROFLOXACIN HCL 500 MG PO TABS: ORAL_TABLET | ORAL | Status: DC

## 2015-03-30 MED ORDER — NA SULFATE-K SULFATE-MG SULF 17.5-3.13-1.6 GM/177ML PO SOLN: 1.0000 | Freq: Once | ORAL | Status: AC

## 2015-03-30 MED ORDER — SACCHAROMYCES BOULARDII 250 MG PO CAPS: 250.0000 mg | ORAL_CAPSULE | Freq: Two times a day (BID) | ORAL | Status: DC

## 2015-03-30 NOTE — Progress Notes (Signed)
Agree with assessment and plan as outlined.  

## 2015-03-30 NOTE — Patient Instructions (Addendum)
Please go to the basement level to have your labs drawn.  You have been given a separate informational sheet regarding your tobacco use, the importance of quitting and local resources to help you quit. We sent prescriptions to [facility]. 1. Cipro 500 mg 2. Flagyl 500 mg 3. Florastor probiotic- Take 1 tab twice daily for 6 weeks. 4. Suprep - colonoscopy prep  For Constipation, use Miralax powder 17 grams in 8 0z of water or juice daily.   Call if your pain does not resolve within the next 10 days, you can ask for [facility]'s nurse.

## 2015-03-30 NOTE — Progress Notes (Signed)
Patient ID: Lisa [name], female   DOB: 1970/11/10, 44 y.o.   MRN: [id number]   Subjective:    Patient ID: Lisa [name], female    DOB: 1971/04/09, 44 y.o.   MRN: [id number]  HPI  Lisa [name] is a pleasant 44 year old African-American female new to GI today referred by [name] internal medicine after a recent admission 11/26 through 11/28 for diverticulitis. She was not evaluated by GI during that admission. She had CT of the abdomen and pelvis on admission which showed a left-sided diverticulosis and an inflammatory process around the left Lisa with thickening of the wall consistent with a focal colitis or diverticulitis.. WBC was 17.9 on admission. She was treated with a course of Cipro and Flagyl and believes she completed 14 days total. She says her pain is definitely better than when she was in the hospital but has not completely resolved. Other than chronic constipation she has not had any prior GI issues. He says she typically will go 5-6 days between bowel movements. She's not noted any melena or hematochezia. She is still having persistent discomfort on the left side which feels like a heavy "walk" in her left abdomen. She has not had any fever or chills. Appetite is been somewhat decreased and she says she's afraid to eat too much because seems to aggravate the pain. Family history is negative for GI diseases far she is aware. She is status post hysterectomy.  Review of Systems Pertinent positive and negative review of systems were noted in the above HPI section.  All other review of systems was otherwise negative.  Outpatient Encounter Prescriptions as of 03/30/2015  Medication Sig  . cloNIDine (CATAPRES) 0.2 MG tablet Take 0.2 mg by mouth 2 (two) times daily.  Marland Kitchen docusate sodium (COLACE) 100 MG capsule Take 1 capsule (100 mg total) by mouth 2 (two) times daily as needed for mild constipation.  . metoprolol (LOPRESSOR) 50 MG tablet Take 50 mg by mouth 2 (two) times daily.    . Multiple Vitamin (MULTIVITAMIN) tablet Take 1 tablet by mouth daily.  . ciprofloxacin (CIPRO) 500 MG tablet Take 1 tab twice daily x 14 days.  . metroNIDAZOLE (FLAGYL) 500 MG tablet Take 1 tab twice daily x 14 days.  . Na Sulfate-K Sulfate-Mg Sulf SOLN Take 1 kit by mouth once.  . saccharomyces boulardii (FLORASTOR) 250 MG capsule Take 1 capsule (250 mg total) by mouth 2 (two) times daily.  . [DISCONTINUED] amLODipine-benazepril (LOTREL) 5-40 MG per capsule Take 1 capsule by mouth daily.  . [DISCONTINUED] cholecalciferol (VITAMIN D) 1000 UNITS tablet Take 1,000 Units by mouth daily.  . [DISCONTINUED] ciprofloxacin (CIPRO) 500 MG tablet Take 1 tablet (500 mg total) by mouth 2 (two) times daily.  . [DISCONTINUED] estradiol (VIVELLE-DOT) 0.075 MG/24HR Place 1 patch onto the skin 2 (two) times a week.  . [DISCONTINUED] HYDROcodone-acetaminophen (NORCO/VICODIN) 5-325 MG tablet Take 1 tablet by mouth every 6 (six) hours as needed for moderate pain.  . [DISCONTINUED] metroNIDAZOLE (FLAGYL) 500 MG tablet Take 1 tablet (500 mg total) by mouth 3 (three) times daily.  . [DISCONTINUED] naproxen sodium (ANAPROX) 220 MG tablet Take 440 mg by mouth 2 (two) times daily with a meal.  . [DISCONTINUED] valsartan-hydrochlorothiazide (DIOVAN-HCT) 320-25 MG per tablet Take 1 tablet by mouth daily.   No facility-administered encounter medications on file as of 03/30/2015.   Allergies  Allergen Reactions  . Ambien [Zolpidem]     Hallucinate   Patient Active Problem List   Diagnosis  Date Noted  . Acute diverticulitis 03/11/2015  . Leukocytosis 03/11/2015  . Tachycardia 03/11/2015  . Tobacco use 03/11/2015  . Obesity 03/11/2015  . Diabetes mellitus without complication ([place])   . Hypertension   . IBS (irritable bowel syndrome)    Social History   Social History  . Marital Status: Married    Spouse Name: N/A  . Number of Children: 2  . Years of Education: N/A   Occupational History  . Not on file.    Social History Main Topics  . Smoking status: Current Every Day Smoker -- 1.00 packs/day for 20 years    Types: Cigarettes  . Smokeless tobacco: Never Used  . Alcohol Use: No  . Drug Use: No  . Sexual Activity: Yes   Other Topics Concern  . Not on file   Social History Narrative    Lisa [name]'s family history includes COPD in her mother; Congestive Heart Failure in her father; Diabetes in her brother.      Objective:    Filed Vitals:   03/30/15 1017  BP: 136/96  Pulse: 88    Physical Exam   well-developed African-American female in no acute distress, pleasant blood pressure 136/96 pulse 88 height 5 foot 8 weight 263, BMI 40. HEENT; nontraumatic normocephalic EOMI PERRLA sclera anicteric, Cardiovascular; regular rate and rhythm with S1-S2 no murmur or gallop, Pulmonary ;clear bilaterally, Abdomen ;large soft she is tender in the left lower quadrant, no guarding or rebound no palpable mass or hepatosplenomegaly, bowel sounds are present, Rectal; exam not done, Ext;no clubbing cyanosis or edema skin warm and dry, Neuropsych ;mood and affect appropriate       Assessment & Plan:   #1 44 yo female with recent hospitalization with acute diverticulitis- first episode. Pt has improved but has persistent left sided abdominal  Pain-suspect partially treated diverticulitis #2 HTN #3 Obesity #4 chronic constipation  Plan; Will treat with a longer course of antibiotics- Cipro 500 mg po BID and Flagyl 500 mg po BID x 14 more day  Start Florastor BID x 4-6 weeks  Start Miralax 17 gm in 8 oz water daily  repeat CBC today Will schedule for Colonoscopy with Lisa [name] in 4-6 weeks. Procedure discussed in detail with pt, and she is agreeable to proceed. Pt is advised to call if sxs have not improved in one week or if at any point sxs worse,then would repeat CT imaging. We discussed need for resolution  Of diverticulitis prior to colonoscopy.   Lisa [name] Lisa Harold  PA-C 03/30/2015   Cc: Lisa Landsman, MD

## 2015-04-05 ENCOUNTER — Telehealth: Payer: Self-pay | Admitting: Physician Assistant

## 2015-04-05 NOTE — Telephone Encounter (Signed)
Spoke with the patient. She states her pain is less and she does feel she is improving. She plans to return to work on 04/11/15.

## 2015-04-12 ENCOUNTER — Telehealth: Payer: Self-pay | Admitting: Physician Assistant

## 2015-04-12 DIAGNOSIS — R1032 Left lower quadrant pain: Secondary | ICD-10-CM

## 2015-04-12 NOTE — Telephone Encounter (Signed)
Hospitalized for acute diverticulitis 03/11/15. Saw you for follow up 03/30/15.  Patient went back to work 04/11/15 as she had planned. With her increased activity her pain has gone from "better" to "worse and like it was when I went into the hospital". The pain is in her left side at the base of her rib cage. She is constipated also. Please advise on what to do.

## 2015-04-13 NOTE — Telephone Encounter (Signed)
She needs another Ct abd/pelvis -please try to get it done by tomorrow at latest

## 2015-04-13 NOTE — Telephone Encounter (Signed)
Patient is scheduled for tomorrow at 9:30 am.  Our phone system

## 2015-04-13 NOTE — Telephone Encounter (Signed)
Phone system is down.  Stacy in CT was able to reach the patient and she verbalized understanding of instructions. And CT scheduled for tomorrow.

## 2015-04-14 ENCOUNTER — Ambulatory Visit (INDEPENDENT_AMBULATORY_CARE_PROVIDER_SITE_OTHER)
Admission: RE | Admit: 2015-04-14 | Discharge: 2015-04-14 | Disposition: A | Discharge status: Home / Self Care | Payer: 59 | Source: Ambulatory Visit | Attending: Physician Assistant | Admitting: Physician Assistant

## 2015-04-14 DIAGNOSIS — R1032 Left lower quadrant pain: Secondary | ICD-10-CM

## 2015-04-14 MED ORDER — IOHEXOL 300 MG/ML  SOLN
100.0000 mL | Freq: Once | INTRAMUSCULAR | Status: AC | PRN
  Administered 2015-04-14: 100 mL via INTRAVENOUS

## 2015-05-05 ENCOUNTER — Ambulatory Visit (AMBULATORY_SURGERY_CENTER): Payer: 59 | Admitting: Gastroenterology

## 2015-05-05 ENCOUNTER — Encounter: Payer: Self-pay | Admitting: Gastroenterology

## 2015-05-05 VITALS — BP 124/88 | HR 79 | Temp 97.8°F | Resp 17 | Ht 68.0 in | Wt 263.0 lb

## 2015-05-05 DIAGNOSIS — R1032 Left lower quadrant pain: Secondary | ICD-10-CM | POA: Diagnosis present

## 2015-05-05 LAB — GLUCOSE, CAPILLARY
Glucose-Capillary: 103 mg/dL — ABNORMAL HIGH (ref 65–99)
Glucose-Capillary: 94 mg/dL (ref 65–99)

## 2015-05-05 MED ORDER — SODIUM CHLORIDE 0.9 % IV SOLN: 500.0000 mL | INTRAVENOUS | Status: DC

## 2015-05-05 NOTE — Progress Notes (Signed)
BP elevated on admitting 173/123 96, denies headache, chest pain, sob or dizziness Last took BP meds 2 days ago, everyday heavy smoker

## 2015-05-05 NOTE — Progress Notes (Addendum)
Patient was quite uncomfortable due to her poor prep, incomplete colonoscopy and air.  Dr. Loni Muse stated that she could be unhooked to go to the bathroom.   She went to the bathroom with excellent results.   Patient was in recovery for awhile due to the rescheduling of her double prep colonoscopy and previsit.

## 2015-05-05 NOTE — Patient Instructions (Signed)
YOU HAD AN ENDOSCOPIC PROCEDURE TODAY AT [facility]:   Refer to the procedure report that was given to you for any specific questions about what was found during the examination.  If the procedure report does not answer your questions, please call your gastroenterologist to clarify.  If you requested that your care partner not be given the details of your procedure findings, then the procedure report has been included in a sealed envelope for you to review at your convenience later.  YOU SHOULD EXPECT: Some feelings of bloating in the abdomen. Passage of more gas than usual.  Walking can help get rid of the air that was put into your GI tract during the procedure and reduce the bloating. If you had a lower endoscopy (such as a colonoscopy or flexible sigmoidoscopy) you may notice spotting of blood in your stool or on the toilet paper. If you underwent a bowel prep for your procedure, you may not have a normal bowel movement for a few days.  Please Note:  You might notice some irritation and congestion in your nose or some drainage.  This is from the oxygen used during your procedure.  There is no need for concern and it should clear up in a day or so.  SYMPTOMS TO REPORT IMMEDIATELY:   Following lower endoscopy (colonoscopy or flexible sigmoidoscopy):  Excessive amounts of blood in the stool  Significant tenderness or worsening of abdominal pains  Swelling of the abdomen that is new, acute  Fever of 100F or higher   For urgent or emergent issues, a gastroenterologist can be reached at any hour by calling [phone].   DIET: Your first meal following the procedure should be a small meal and then it is ok to progress to your normal diet. Heavy or fried foods are harder to digest and may make you feel nauseous or bloated.  Likewise, meals heavy in dairy and vegetables can increase bloating.  Drink plenty of fluids but you should avoid alcoholic beverages for 24 hours. Increase  the fiber in your diet.  ACTIVITY:  You should plan to take it easy for the rest of today and you should NOT DRIVE or use heavy machinery until tomorrow (because of the sedation medicines used during the test).    FOLLOW UP: Our staff will call the number listed on your records the next business day following your procedure to check on you and address any questions or concerns that you may have regarding the information given to you following your procedure. If we do not reach you, we will leave a message.  However, if you are feeling well and you are not experiencing any problems, there is no need to return our call.  We will assume that you have returned to your regular daily activities without incident.  If any biopsies were taken you will be contacted by phone or by letter within the next 1-3 weeks.  Please call us at [phone] if you have not heard about the biopsies in 3 weeks.    SIGNATURES/CONFIDENTIALITY: You and/or your care partner have signed paperwork which will be entered into your electronic medical record.  These signatures attest to the fact that that the information above on your After Visit Summary has been reviewed and is understood.  Full responsibility of the confidentiality of this discharge information lies with you and/or your care-partner.  Read all of the handouts given to you by your recovery room nurse.  Thank-you for choosing [facility]  for your healthcare needs today.

## 2015-05-05 NOTE — Progress Notes (Signed)
Report to PACU, RN, vss, BBS= Clear.  

## 2015-05-05 NOTE — Op Note (Signed)
[facility]  [address] [place], [address]   COLONOSCOPY PROCEDURE REPORT  PATIENT: Lisa [name], Lisa [name]  MR#: [id number] BIRTHDATE: 11-06-1970 , 44  yrs. old GENDER: female ENDOSCOPIST: Yetta Flock, MD REFERRED BY: Lin Landsman MD PROCEDURE DATE:  05/05/2015 PROCEDURE:   Colonoscopy, diagnostic  ASA CLASS:   Class III INDICATIONS:diverticulitis noted in the left colon on CT and Colorectal Neoplasm Risk Assessment for this procedure is average risk. MEDICATIONS: Propofol 200 mg IV  DESCRIPTION OF PROCEDURE:   After the risks benefits and alternatives of the procedure were thoroughly explained, informed consent was obtained.  The digital rectal exam revealed no abnormalities of the rectum.   The LB PFC-H190 [id number]  endoscope was introduced through the anus and advanced to the ascending colon. No adverse events experienced.   The quality of the prep was poor.  The instrument was then slowly withdrawn as the colon was fully examined. Estimated blood loss is zero unless otherwise noted in this procedure report.   COLON FINDINGS: The bowel preparation was poor and got progressively worse during the intubation.  I suspect the ascending colon was reached, however visualization was lost due to poor prep and I could not safely advance the endoscope to the cecum and the scope was withdrawn.  Some scattered diverticula were noted in the left and transverse colon, although overall visualization was poor and polyps or mass lesions could have been missed.  This exam was inadequate for screening purposes.  Retroflexed views revealed internal hemorrhoids. The time to cecum =    Withdrawal time = The scope was withdrawn and the procedure completed. COMPLICATIONS: There were no immediate complications.  ENDOSCOPIC IMPRESSION: Poor bowel prepation with limited visualization, the extent of the exam was somewhere in the right colon Diverticulosis was appreciated in the  suspected transverse colon and proximal left colon Poor visualization otherwise and the exam was inadequate for screening purposes  RECOMMENDATIONS: Resume diet Resume medications Recommend we reschedule this exam using a different bowel preparation or 2 day preparation to ensure an adequate exam is done in light of CT scan findings. You will be contacted to schedule this.  eSigned:  Yetta Flock, MD 05/05/2015 3:52 PM   cc: Lin Landsman MD, the patient

## 2015-05-08 ENCOUNTER — Telehealth: Payer: Self-pay | Admitting: *Deleted

## 2015-05-08 NOTE — Telephone Encounter (Signed)
No answer, message left for the patient. 

## 2015-05-17 ENCOUNTER — Ambulatory Visit (AMBULATORY_SURGERY_CENTER): Payer: Self-pay | Admitting: *Deleted

## 2015-05-17 VITALS — Ht 68.0 in | Wt 266.0 lb

## 2015-05-17 DIAGNOSIS — R1032 Left lower quadrant pain: Secondary | ICD-10-CM

## 2015-05-17 MED ORDER — MOVIPREP 100 G PO SOLR: ORAL | Status: DC

## 2015-05-17 NOTE — Progress Notes (Signed)
Patient denies any allergies to eggs or soy. Patient denies any problems with anesthesia/sedation. Patient denies any oxygen use at home and does not take any diet/weight loss medications.  

## 2015-05-31 ENCOUNTER — Ambulatory Visit (AMBULATORY_SURGERY_CENTER): Payer: 59 | Admitting: Gastroenterology

## 2015-05-31 ENCOUNTER — Encounter: Payer: Self-pay | Admitting: Gastroenterology

## 2015-05-31 VITALS — BP 116/79 | HR 64 | Temp 98.1°F | Resp 15 | Ht 68.0 in | Wt 263.0 lb

## 2015-05-31 DIAGNOSIS — D128 Benign neoplasm of rectum: Secondary | ICD-10-CM

## 2015-05-31 DIAGNOSIS — D124 Benign neoplasm of descending colon: Secondary | ICD-10-CM | POA: Diagnosis not present

## 2015-05-31 DIAGNOSIS — D125 Benign neoplasm of sigmoid colon: Secondary | ICD-10-CM | POA: Diagnosis not present

## 2015-05-31 DIAGNOSIS — R1032 Left lower quadrant pain: Secondary | ICD-10-CM

## 2015-05-31 DIAGNOSIS — K621 Rectal polyp: Secondary | ICD-10-CM | POA: Diagnosis not present

## 2015-05-31 DIAGNOSIS — R933 Abnormal findings on diagnostic imaging of other parts of digestive tract: Secondary | ICD-10-CM

## 2015-05-31 LAB — GLUCOSE, CAPILLARY
GLUCOSE-CAPILLARY: 100 mg/dL — AB (ref 65–99)
GLUCOSE-CAPILLARY: 85 mg/dL (ref 65–99)
Glucose-Capillary: 86 mg/dL (ref 65–99)

## 2015-05-31 MED ORDER — SODIUM CHLORIDE 0.9 % IV SOLN: 500.0000 mL | INTRAVENOUS | Status: DC

## 2015-05-31 NOTE — Op Note (Signed)
[facility]  [address]. [place], [address]   COLONOSCOPY PROCEDURE REPORT  PATIENT: Lisa [name], Lisa [name]  MR#: [id number] BIRTHDATE: Jul 18, 1970 , 44  yrs. old GENDER: female ENDOSCOPIST: Yetta Flock, MD REFERRED BY: PROCEDURE DATE:  05/31/2015 PROCEDURE:   Colonoscopy, diagnostic and Colonoscopy with biopsy First Screening Colonoscopy - Avg.  risk and is 50 yrs.  old or older - No.  Prior Negative Screening - Now for repeat screening. N/A  History of Adenoma - Now for follow-up colonoscopy & has been > or = to 3 yrs.  N/A  Polyps removed today? Yes ASA CLASS:   Class III INDICATIONS:abnormal CT scan, left sided diverticulitis, average risk, last exam limited by poor prep. MEDICATIONS: Propofol 700 mg IV  DESCRIPTION OF PROCEDURE:   After the risks benefits and alternatives of the procedure were thoroughly explained, informed consent was obtained.  The digital rectal exam revealed no abnormalities of the rectum.   The LB PFC-H190 [id number] and LB PFC-H190 [id number]  endoscope was introduced through the anus and advanced to the cecum, which was identified by both the appendix and ileocecal valve. No adverse events experienced.   The quality of the prep was adequate  The instrument was then slowly withdrawn as the colon was fully examined. Estimated blood loss is zero unless otherwise noted in this procedure report.      COLON FINDINGS: The bowel preparation was initially fair. Following significant lavage, the prep was adequate for screening purposes. The colon was extremely redundant and initially a pediatric colonoscope was used but cecal intubation was not possible with this colonsocope.  The pediatric colonoscope was removed and replaced with the adult colonoscope and then the cecum was reached using abdominal wall pressure.  One 55mm sessile polyp was noted in the descending colon and removed with cold forceps.  Three sessile polyps in the sigmoid colon  66mm in size were noted and removed with cold forceps.  Two sessile polyps in the rectum roughly 55mm in size were removed with cold forceps.  Mild to moderate diverticulosis was noted in the left colon to correlate with prior CT scan findings.  Retroflexed views revealed internal hemorrhoids. The time to cecum = 29.3 Withdrawal time = 19.1   The scope was withdrawn and the procedure completed. COMPLICATIONS: There were no immediate complications.  ENDOSCOPIC IMPRESSION: Fair bowel prep initially requiring significant lavage to obtain adequate views for this exam Redundant colon requiring use of adult colonoscope with abdominal pressure to intubate the cecum Multiple small colon polyps removed with cold forceps Diverticulosis of the left colon which correlates to prior diagnosis of diverticulitis  RECOMMENDATIONS: Await pathology results Resume diet Resume medications  eSigned:  Yetta Flock, MD 05/31/2015 2:32 PM   cc:  the patient   PATIENT NAME:  Lisa [name], Lisa [name] MR#: [id number]

## 2015-05-31 NOTE — Progress Notes (Signed)
Recheck blood glucose -100. d5w at [facility].

## 2015-05-31 NOTE — Patient Instructions (Signed)
Impressions/recommendations:  Polyps (handout given) Diverticulosis (handout given) High Fiber Diet (handout given)  YOU HAD AN ENDOSCOPIC PROCEDURE TODAY AT THE [facility]:   Refer to the procedure report that was given to you for any specific questions about what was found during the examination.  If the procedure report does not answer your questions, please call your gastroenterologist to clarify.  If you requested that your care partner not be given the details of your procedure findings, then the procedure report has been included in a sealed envelope for you to review at your convenience later.  YOU SHOULD EXPECT: Some feelings of bloating in the abdomen. Passage of more gas than usual.  Walking can help get rid of the air that was put into your GI tract during the procedure and reduce the bloating. If you had a lower endoscopy (such as a colonoscopy or flexible sigmoidoscopy) you may notice spotting of blood in your stool or on the toilet paper. If you underwent a bowel prep for your procedure, you may not have a normal bowel movement for a few days.  Please Note:  You might notice some irritation and congestion in your nose or some drainage.  This is from the oxygen used during your procedure.  There is no need for concern and it should clear up in a day or so.  SYMPTOMS TO REPORT IMMEDIATELY:   Following lower endoscopy (colonoscopy or flexible sigmoidoscopy):  Excessive amounts of blood in the stool  Significant tenderness or worsening of abdominal pains  Swelling of the abdomen that is new, acute  Fever of 100F or higher  For urgent or emergent issues, a gastroenterologist can be reached at any hour by calling [phone].   DIET: Your first meal following the procedure should be a small meal and then it is ok to progress to your normal diet. Heavy or fried foods are harder to digest and may make you feel nauseous or bloated.  Likewise, meals heavy in dairy and  vegetables can increase bloating.  Drink plenty of fluids but you should avoid alcoholic beverages for 24 hours.  ACTIVITY:  You should plan to take it easy for the rest of today and you should NOT DRIVE or use heavy machinery until tomorrow (because of the sedation medicines used during the test).    FOLLOW UP: Our staff will call the number listed on your records the next business day following your procedure to check on you and address any questions or concerns that you may have regarding the information given to you following your procedure. If we do not reach you, we will leave a message.  However, if you are feeling well and you are not experiencing any problems, there is no need to return our call.  We will assume that you have returned to your regular daily activities without incident.  If any biopsies were taken you will be contacted by phone or by letter within the next 1-3 weeks.  Please call us at [phone] if you have not heard about the biopsies in 3 weeks.    SIGNATURES/CONFIDENTIALITY: You and/or your care partner have signed paperwork which will be entered into your electronic medical record.  These signatures attest to the fact that that the information above on your After Visit Summary has been reviewed and is understood.  Full responsibility of the confidentiality of this discharge information lies with you and/or your care-partner. 

## 2015-05-31 NOTE — Progress Notes (Signed)
Called to room to assist during endoscopic procedure.  Patient ID and intended procedure confirmed with present staff. Received instructions for my participation in the procedure from the performing physician.  

## 2015-05-31 NOTE — Progress Notes (Signed)
To pacu vss patent aw report to rn 

## 2015-06-01 ENCOUNTER — Telehealth: Payer: Self-pay

## 2015-06-01 NOTE — Telephone Encounter (Signed)
  Follow up Call-  Call back number 05/31/2015 05/05/2015  Post procedure Call Back phone  # [phone]  Permission to leave phone message Yes Yes     Patient questions:  Do you have a fever, pain , or abdominal swelling? No. Pain Score  0 *  Have you tolerated food without any problems? Yes.    Have you been able to return to your normal activities? Yes.    Do you have any questions about your discharge instructions: Diet   No. Medications  No. Follow up visit  No.  Do you have questions or concerns about your Care? No.  Actions: * If pain score is 4 or above: No action needed, pain <4.

## 2015-06-05 ENCOUNTER — Encounter: Payer: Self-pay | Admitting: Gastroenterology

## 2015-11-27 ENCOUNTER — Encounter (HOSPITAL_COMMUNITY): Payer: Self-pay | Admitting: *Deleted

## 2015-11-27 ENCOUNTER — Emergency Department (HOSPITAL_COMMUNITY)
Admission: EM | Admit: 2015-11-27 | Discharge: 2015-11-27 | Disposition: A | Discharge status: Home / Self Care | Payer: 59 | Attending: Emergency Medicine | Admitting: Emergency Medicine

## 2015-11-27 DIAGNOSIS — E1165 Type 2 diabetes mellitus with hyperglycemia: Secondary | ICD-10-CM

## 2015-11-27 DIAGNOSIS — I10 Essential (primary) hypertension: Secondary | ICD-10-CM | POA: Insufficient documentation

## 2015-11-27 DIAGNOSIS — Z7984 Long term (current) use of oral hypoglycemic drugs: Secondary | ICD-10-CM | POA: Diagnosis not present

## 2015-11-27 DIAGNOSIS — F17210 Nicotine dependence, cigarettes, uncomplicated: Secondary | ICD-10-CM | POA: Diagnosis not present

## 2015-11-27 DIAGNOSIS — R739 Hyperglycemia, unspecified: Secondary | ICD-10-CM

## 2015-11-27 LAB — BASIC METABOLIC PANEL
Anion gap: 11 (ref 5–15)
BUN: 13 mg/dL (ref 6–20)
CHLORIDE: 95 mmol/L — AB (ref 101–111)
CO2: 23 mmol/L (ref 22–32)
Calcium: 10.4 mg/dL — ABNORMAL HIGH (ref 8.9–10.3)
Creatinine, Ser: 1.14 mg/dL — ABNORMAL HIGH (ref 0.44–1.00)
GFR calc non Af Amer: 58 mL/min — ABNORMAL LOW (ref >60)
Glucose, Bld: 512 mg/dL (ref 65–99)
POTASSIUM: 4.3 mmol/L (ref 3.5–5.1)
SODIUM: 129 mmol/L — AB (ref 135–145)

## 2015-11-27 LAB — CBG MONITORING, ED
Glucose-Capillary: 482 mg/dL — ABNORMAL HIGH (ref 65–99)
Glucose-Capillary: 367 mg/dL — ABNORMAL HIGH (ref 65–99)

## 2015-11-27 LAB — URINALYSIS, ROUTINE W REFLEX MICROSCOPIC
Bilirubin Urine: NEGATIVE
Ketones, ur: 15 mg/dL — AB
LEUKOCYTES UA: NEGATIVE
Nitrite: NEGATIVE
PH: 5.5 (ref 5.0–8.0)
PROTEIN: NEGATIVE mg/dL
Specific Gravity, Urine: 1.046 — ABNORMAL HIGH (ref 1.005–1.030)

## 2015-11-27 LAB — URINE MICROSCOPIC-ADD ON
BACTERIA UA: NONE SEEN
SQUAMOUS EPITHELIAL / LPF: NONE SEEN
WBC, UA: NONE SEEN WBC/hpf (ref 0–5)

## 2015-11-27 LAB — CBC
HEMATOCRIT: 42 % (ref 36.0–46.0)
Hemoglobin: 15 g/dL (ref 12.0–15.0)
MCH: 32.2 pg (ref 26.0–34.0)
MCHC: 35.7 g/dL (ref 30.0–36.0)
MCV: 90.1 fL (ref 78.0–100.0)
PLATELETS: 333 10*3/uL (ref 150–400)
RBC: 4.66 MIL/uL (ref 3.87–5.11)
RDW: 13 % (ref 11.5–15.5)
WBC: 13.9 10*3/uL — AB (ref 4.0–10.5)

## 2015-11-27 MED ORDER — SODIUM CHLORIDE 0.9 % IV BOLUS (SEPSIS)
1000.0000 mL | Freq: Once | INTRAVENOUS | Status: AC
  Administered 2015-11-27: 1000 mL via INTRAVENOUS

## 2015-11-27 MED ORDER — METFORMIN HCL 500 MG PO TABS: 500.0000 mg | ORAL_TABLET | Freq: Two times a day (BID) | ORAL | 0 refills | Status: DC

## 2015-11-27 NOTE — Discharge Instructions (Addendum)
You have diabetes and you will need to take medications for diabetes.   Please set up follow-up with primary care doctor listed above.    Please return without fail for worsening symptoms, including severe abdominal pain or chest pain, passing out, intractable vomiting, or any other symptoms concerning to you.

## 2015-11-27 NOTE — ED Notes (Signed)
Pt reporting excessive thirst and frequent urination since Friday with history of DM; pt states she was on metformin but no longer; does not check CBGs at home regularly

## 2015-11-27 NOTE — ED Notes (Signed)
CBG 367 RN notified

## 2015-11-27 NOTE — ED Provider Notes (Signed)
[facility] DEPT Provider Note   CSN: [id number] Arrival date & time: 11/27/15  1703     History   Chief Complaint Chief Complaint  Patient presents with  . Hyperglycemia    HPI Lisa [name] is a 45 y.o. female.  HPI  45 year old female who presents with hyperglycemia. She has history of prediabetes, and states that she did initially take metformin back in 2010, blood sugar improved with diet and exercise, and she was told to come off of this medication. States that 1 week ago she developed polyuria and polydipsia, blurry vision for days ago, and today checked her blood sugar at her job and it was elevated today for 91. Has had about 5-6 pound weight loss over one week, having increased hunger during this time as well. No chest pain, difficulty breathing, recent infection, fever or chills, nausea or vomiting, abdominal pain, diarrhea, dysuria.  Past Medical History:  Diagnosis Date  . Diabetes mellitus without complication ([place])   . Diverticulitis   . Hypertension   . IBS (irritable bowel syndrome)     Patient Active Problem List   Diagnosis Date Noted  . Acute diverticulitis 03/11/2015  . Leukocytosis 03/11/2015  . Tachycardia 03/11/2015  . Tobacco use 03/11/2015  . Obesity 03/11/2015  . Diabetes mellitus without complication ([place])   . Hypertension   . IBS (irritable bowel syndrome)     Past Surgical History:  Procedure Laterality Date  . ABDOMINAL HYSTERECTOMY      OB History    No data available       Home Medications    Prior to Admission medications   Medication Sig Start Date End Date Taking? Authorizing Provider  docusate sodium (COLACE) 100 MG capsule Take 1 capsule (100 mg total) by mouth 2 (two) times daily as needed for mild constipation. Patient not taking: Reported on 05/17/2015 03/13/15   Thurnell Lose, MD  metFORMIN (GLUCOPHAGE) 500 MG tablet Take 1 tablet (500 mg total) by mouth 2 (two) times daily with a meal. After 7 days, increase your  dose to 1000 mg in the morning with meal and 500 mg in the evening with meal. After next 7 days, increase dose to 1000 mg two times daily with meal. 11/27/15   Forde Dandy, MD  saccharomyces boulardii (FLORASTOR) 250 MG capsule Take 1 capsule (250 mg total) by mouth 2 (two) times daily. Patient not taking: Reported on 05/17/2015 03/30/15   Amy Genia Harold, PA-C    Family History Family History  Problem Relation Age of Onset  . COPD Mother   . Congestive Heart Failure Father   . Diabetes Brother   . Irritable bowel syndrome      Constipation   . Colon cancer Paternal Grandmother 22    Social History Social History  Substance Use Topics  . Smoking status: Current Every Day Smoker    Packs/day: 1.00    Years: 20.00    Types: Cigarettes  . Smokeless tobacco: Never Used  . Alcohol use No     Allergies   Ambien [zolpidem]   Review of Systems Review of Systems 10/14 systems reviewed and are negative other than those stated in the HPI   Physical Exam Updated Vital Signs BP 124/85   Pulse 76   Temp 98.3 F (36.8 C) (Oral)   Resp 17   Ht 5\' 8"  (1.727 m)   Wt 258 lb 9.6 oz (117.3 kg)   LMP 01/28/2012   SpO2 98%   BMI 39.32 kg/m  Physical Exam Physical Exam  Nursing note and vitals reviewed. Constitutional: Well developed, well nourished, non-toxic, and in no acute distress Head: Normocephalic and atraumatic.  Mouth/Throat: Oropharynx is clear and dry mucous membranes.  Neck: Normal range of motion. Neck supple.  Cardiovascular: Normal rate and regular rhythm.   Pulmonary/Chest: Effort normal and breath sounds normal.  Abdominal: Soft. There is no tenderness. There is no rebound and no guarding.  Musculoskeletal: Normal range of motion.  Neurological: Alert, no facial droop, fluent speech, moves all extremities symmetrically Skin: Skin is warm and dry.  Psychiatric: Cooperative  ED Treatments / Results  Labs (all labs ordered are listed, but only abnormal  results are displayed) Labs Reviewed  BASIC METABOLIC PANEL - Abnormal; Notable for the following:       Result Value   Sodium 129 (*)    Chloride 95 (*)    Glucose, Bld 512 (*)    Creatinine, Ser 1.14 (*)    Calcium 10.4 (*)    GFR calc non Af Amer 58 (*)    All other components within normal limits  CBC - Abnormal; Notable for the following:    WBC 13.9 (*)    All other components within normal limits  URINALYSIS, ROUTINE W REFLEX MICROSCOPIC (NOT AT [facility]) - Abnormal; Notable for the following:    Specific Gravity, Urine 1.046 (*)    Glucose, UA >1000 (*)    Hgb urine dipstick LARGE (*)    Ketones, ur 15 (*)    All other components within normal limits  CBG MONITORING, ED - Abnormal; Notable for the following:    Glucose-Capillary 482 (*)    All other components within normal limits  CBG MONITORING, ED - Abnormal; Notable for the following:    Glucose-Capillary 367 (*)    All other components within normal limits  URINE MICROSCOPIC-ADD ON    EKG  EKG Interpretation None       Radiology No results found.  Procedures Procedures (including critical care time)  Medications Ordered in ED Medications  sodium chloride 0.9 % bolus 1,000 mL (0 mLs Intravenous Stopped 11/27/15 2201)  sodium chloride 0.9 % bolus 1,000 mL (0 mLs Intravenous Stopped 11/27/15 2201)     Initial Impression / Assessment and Plan / ED Course  I have reviewed the triage vital signs and the nursing notes.  Pertinent labs & imaging results that were available during my care of the patient were reviewed by me and considered in my medical decision making (see chart for details).  Clinical Course    Previously on metformin but states it was discontinued several years ago due to well maintained blood sugars w/ diet and exercise. With diabetes now, hyperglycemic 500s. No DKA, as normal AG and normal bicarbonate.With 2 L of IVF, glucose in 300.  Mild AKI, but GFR > 40 and able to restart metformin.  Discussed with case management, who has arranged follow-up with new PCP accepting new patient through [facility] for close follow-up. Strict return and follow-up instructions reviewed. She expressed understanding of all discharge instructions and felt comfortable with the plan of care.  The patient appears reasonably screened and/or stabilized for discharge and I doubt any other medical condition or other [facility] requiring further screening, evaluation, or treatment in the ED at this time prior to discharge.    Final Clinical Impressions(s) / ED Diagnoses   Final diagnoses:  Hyperglycemia  Type 2 diabetes mellitus with hyperglycemia, without long-term current use of insulin ([place])  New Prescriptions New Prescriptions   METFORMIN (GLUCOPHAGE) 500 MG TABLET    Take 1 tablet (500 mg total) by mouth 2 (two) times daily with a meal. After 7 days, increase your dose to 1000 mg in the morning with meal and 500 mg in the evening with meal. After next 7 days, increase dose to 1000 mg two times daily with meal.     Forde Dandy, MD 11/28/15 1320

## 2015-11-27 NOTE — ED Triage Notes (Signed)
Pt states her blood sugar was 491 twenty five minutes prior to ED arrival. Pt reports excessive thirst, excessive urination, mild lightheadedness and dizziness. Pt also reports blurry vision since Friday.

## 2015-11-29 ENCOUNTER — Encounter (HOSPITAL_COMMUNITY): Payer: Self-pay | Admitting: *Deleted

## 2015-11-29 ENCOUNTER — Telehealth: Payer: Self-pay | Admitting: *Deleted

## 2015-11-29 DIAGNOSIS — J029 Acute pharyngitis, unspecified: Secondary | ICD-10-CM | POA: Diagnosis present

## 2015-11-29 DIAGNOSIS — Z5321 Procedure and treatment not carried out due to patient leaving prior to being seen by health care provider: Secondary | ICD-10-CM | POA: Insufficient documentation

## 2015-11-29 DIAGNOSIS — H538 Other visual disturbances: Secondary | ICD-10-CM | POA: Diagnosis not present

## 2015-11-29 LAB — CBC WITH DIFFERENTIAL/PLATELET
BASOS ABS: 0.1 10*3/uL (ref 0.0–0.1)
Basophils Relative: 1 %
EOS ABS: 0.4 10*3/uL (ref 0.0–0.7)
Eosinophils Relative: 3 %
HEMATOCRIT: 42.7 % (ref 36.0–46.0)
Hemoglobin: 15.3 g/dL — ABNORMAL HIGH (ref 12.0–15.0)
LYMPHS ABS: 4.8 10*3/uL — AB (ref 0.7–4.0)
Lymphocytes Relative: 33 %
MCH: 31.6 pg (ref 26.0–34.0)
MCHC: 35.8 g/dL (ref 30.0–36.0)
MCV: 88.2 fL (ref 78.0–100.0)
MONO ABS: 0.6 10*3/uL (ref 0.1–1.0)
MONOS PCT: 4 %
Neutro Abs: 8.5 10*3/uL — ABNORMAL HIGH (ref 1.7–7.7)
Neutrophils Relative %: 59 %
PLATELETS: 347 10*3/uL (ref 150–400)
RBC: 4.84 MIL/uL (ref 3.87–5.11)
RDW: 12.6 % (ref 11.5–15.5)
WBC: 14.4 10*3/uL — AB (ref 4.0–10.5)

## 2015-11-29 LAB — COMPREHENSIVE METABOLIC PANEL
ALT: 56 U/L — AB (ref 14–54)
AST: 55 U/L — ABNORMAL HIGH (ref 15–41)
Albumin: 4.2 g/dL (ref 3.5–5.0)
Alkaline Phosphatase: 131 U/L — ABNORMAL HIGH (ref 38–126)
Anion gap: 9 (ref 5–15)
BILIRUBIN TOTAL: 0.4 mg/dL (ref 0.3–1.2)
BUN: 8 mg/dL (ref 6–20)
CALCIUM: 10.2 mg/dL (ref 8.9–10.3)
CHLORIDE: 99 mmol/L — AB (ref 101–111)
CO2: 24 mmol/L (ref 22–32)
CREATININE: 0.8 mg/dL (ref 0.44–1.00)
Glucose, Bld: 372 mg/dL — ABNORMAL HIGH (ref 65–99)
Potassium: 3.7 mmol/L (ref 3.5–5.1)
Sodium: 132 mmol/L — ABNORMAL LOW (ref 135–145)
TOTAL PROTEIN: 7.6 g/dL (ref 6.5–8.1)

## 2015-11-29 LAB — CBG MONITORING, ED: GLUCOSE-CAPILLARY: 369 mg/dL — AB (ref 65–99)

## 2015-11-29 NOTE — ED Notes (Signed)
Called for vitals re-check. No answer.

## 2015-11-29 NOTE — ED Triage Notes (Signed)
Pt c/o blurred vision onset last Friday reports being seen here Monday for symptoms with hyperglycemia dx, pt not able to see PCP until next Monday, pt c/o sore throat & productive cough with green sputum, denies SOB & CP, A&O x4

## 2015-11-30 ENCOUNTER — Emergency Department (HOSPITAL_COMMUNITY)
Admission: EM | Admit: 2015-11-30 | Discharge: 2015-11-30 | Disposition: A | Discharge status: Home / Self Care | Payer: 59 | Attending: Dermatology | Admitting: Dermatology

## 2015-11-30 NOTE — ED Notes (Signed)
Pt did not answer. X's 3.

## 2015-12-04 ENCOUNTER — Emergency Department (HOSPITAL_BASED_OUTPATIENT_CLINIC_OR_DEPARTMENT_OTHER)
Admission: EM | Admit: 2015-12-04 | Discharge: 2015-12-04 | Disposition: A | Discharge status: Home / Self Care | Payer: 59 | Attending: Emergency Medicine | Admitting: Emergency Medicine

## 2015-12-04 ENCOUNTER — Encounter (HOSPITAL_BASED_OUTPATIENT_CLINIC_OR_DEPARTMENT_OTHER): Payer: Self-pay

## 2015-12-04 DIAGNOSIS — R631 Polydipsia: Secondary | ICD-10-CM | POA: Diagnosis not present

## 2015-12-04 DIAGNOSIS — R739 Hyperglycemia, unspecified: Secondary | ICD-10-CM

## 2015-12-04 DIAGNOSIS — E119 Type 2 diabetes mellitus without complications: Secondary | ICD-10-CM

## 2015-12-04 DIAGNOSIS — R358 Other polyuria: Secondary | ICD-10-CM | POA: Insufficient documentation

## 2015-12-04 DIAGNOSIS — E1165 Type 2 diabetes mellitus with hyperglycemia: Secondary | ICD-10-CM | POA: Diagnosis present

## 2015-12-04 DIAGNOSIS — I10 Essential (primary) hypertension: Secondary | ICD-10-CM | POA: Insufficient documentation

## 2015-12-04 DIAGNOSIS — H539 Unspecified visual disturbance: Secondary | ICD-10-CM | POA: Insufficient documentation

## 2015-12-04 DIAGNOSIS — Z7984 Long term (current) use of oral hypoglycemic drugs: Secondary | ICD-10-CM | POA: Diagnosis not present

## 2015-12-04 DIAGNOSIS — F17210 Nicotine dependence, cigarettes, uncomplicated: Secondary | ICD-10-CM | POA: Diagnosis not present

## 2015-12-04 LAB — BASIC METABOLIC PANEL
ANION GAP: 10 (ref 5–15)
BUN: 15 mg/dL (ref 6–20)
CALCIUM: 10 mg/dL (ref 8.9–10.3)
CO2: 25 mmol/L (ref 22–32)
Chloride: 96 mmol/L — ABNORMAL LOW (ref 101–111)
Creatinine, Ser: 0.85 mg/dL (ref 0.44–1.00)
Glucose, Bld: 450 mg/dL — ABNORMAL HIGH (ref 65–99)
Potassium: 4.2 mmol/L (ref 3.5–5.1)
Sodium: 131 mmol/L — ABNORMAL LOW (ref 135–145)

## 2015-12-04 LAB — CBC WITH DIFFERENTIAL/PLATELET
BAND NEUTROPHILS: 0 %
BLASTS: 0 %
Basophils Absolute: 0 10*3/uL (ref 0.0–0.1)
Basophils Relative: 0 %
Eosinophils Absolute: 0.1 10*3/uL (ref 0.0–0.7)
Eosinophils Relative: 1 %
HEMATOCRIT: 44.2 % (ref 36.0–46.0)
HEMOGLOBIN: 16.3 g/dL — AB (ref 12.0–15.0)
LYMPHS PCT: 24 %
Lymphs Abs: 3.3 10*3/uL (ref 0.7–4.0)
MCH: 32 pg (ref 26.0–34.0)
MCHC: 36.9 g/dL — AB (ref 30.0–36.0)
MCV: 86.7 fL (ref 78.0–100.0)
MYELOCYTES: 0 %
Metamyelocytes Relative: 0 %
Monocytes Absolute: 0.3 10*3/uL (ref 0.1–1.0)
Monocytes Relative: 2 %
NEUTROS PCT: 73 %
NRBC: 0 /100{WBCs}
Neutro Abs: 10.1 10*3/uL — ABNORMAL HIGH (ref 1.7–7.7)
Other: 0 %
PROMYELOCYTES ABS: 0 %
Platelets: 389 10*3/uL (ref 150–400)
RBC: 5.1 MIL/uL (ref 3.87–5.11)
RDW: 13 % (ref 11.5–15.5)
WBC: 13.8 10*3/uL — ABNORMAL HIGH (ref 4.0–10.5)

## 2015-12-04 LAB — URINALYSIS, ROUTINE W REFLEX MICROSCOPIC
Bilirubin Urine: NEGATIVE
Ketones, ur: 15 mg/dL — AB
LEUKOCYTES UA: NEGATIVE
Nitrite: NEGATIVE
PH: 5.5 (ref 5.0–8.0)
PROTEIN: NEGATIVE mg/dL
SPECIFIC GRAVITY, URINE: 1.039 — AB (ref 1.005–1.030)

## 2015-12-04 LAB — URINE MICROSCOPIC-ADD ON

## 2015-12-04 LAB — CBG MONITORING, ED
GLUCOSE-CAPILLARY: 457 mg/dL — AB (ref 65–99)
GLUCOSE-CAPILLARY: 353 mg/dL — AB (ref 65–99)

## 2015-12-04 MED ORDER — SODIUM CHLORIDE 0.9 % IV BOLUS (SEPSIS)
1000.0000 mL | Freq: Once | INTRAVENOUS | Status: AC
  Administered 2015-12-04: 1000 mL via INTRAVENOUS

## 2015-12-04 MED ORDER — INSULIN REGULAR HUMAN 100 UNIT/ML IJ SOLN
10.0000 [IU] | Freq: Once | INTRAMUSCULAR | Status: AC
  Administered 2015-12-04: 10 [IU] via INTRAVENOUS
  Filled 2015-12-04: qty 1

## 2015-12-04 MED ORDER — BLOOD GLUCOSE MONITOR KIT: PACK | 0 refills | Status: AC

## 2015-12-04 MED ORDER — GLYBURIDE 2.5 MG PO TABS
2.5000 mg | ORAL_TABLET | Freq: Every day | ORAL | 0 refills | Status: DC
Start: 2015-12-04 — End: 2019-08-20

## 2015-12-04 MED FILL — glyBURIDE 2.5 MG TABS: 2.5 | 15 days supply | Qty: 30 | Fill #0

## 2015-12-04 NOTE — ED Notes (Signed)
PA at bedside.

## 2015-12-04 NOTE — ED Triage Notes (Signed)
New dx DM 8/16 at [facility] ED-follow up visit with PCP today-sent due to accucheck reading "high"-NAD-steady gait

## 2015-12-04 NOTE — ED Provider Notes (Signed)
[facility] DEPT [facility] Provider Note   CSN: [id number] Arrival date & time: 12/04/15  1451     History   Chief Complaint Chief Complaint  Patient presents with  . Hyperglycemia    HPI Lisa [name] is a 45 y.o. female.  Lisa [name] is a 45 y.o. Female who presents to the ED from primary care physician's office complaining of hyperglycemia. Patient was recently diagnosed with diabetes 5 days ago in the emergency department. She was started on metformin but reports she never took it. She reports that form and makes her feel sick and she did not take anything for treatment of her diabetes over the past 5 days. At her PCPs office visit today her glucometer read high. She was sent to the emergency department without further instruction. Patient is upset because she does not have a glucose monitor at home and does not want to take metformin. She states over the past 1-2 weeks she has had polyuria and polydipsia. So she reports some intermittent blurry vision. She complains of some abdominal cramping, she reports she gets this frequently. No nausea or vomiting. No diarrhea. She denies fevers, recent illness, chest pain, shortness of breath, nausea, vomiting, diarrhea, dysuria, or rashes.   The history is provided by the patient and medical records. No language interpreter was used.  Hyperglycemia  Associated symptoms: increased thirst and polyuria   Associated symptoms: no abdominal pain, no chest pain, no dizziness, no dysuria, no fever, no nausea, no shortness of breath and no vomiting     Past Medical History:  Diagnosis Date  . Diabetes mellitus without complication ([place])   . Diverticulitis   . Hypertension   . IBS (irritable bowel syndrome)     Patient Active Problem List   Diagnosis Date Noted  . Acute diverticulitis 03/11/2015  . Leukocytosis 03/11/2015  . Tachycardia 03/11/2015  . Tobacco use 03/11/2015  . Obesity 03/11/2015  . Diabetes mellitus without complication  ([place])   . Hypertension   . IBS (irritable bowel syndrome)     Past Surgical History:  Procedure Laterality Date  . ABDOMINAL HYSTERECTOMY      OB History    No data available       Home Medications    Prior to Admission medications   Medication Sig Start Date End Date Taking? Authorizing Provider  blood glucose meter kit and supplies KIT Dispense based on patient and insurance preference. Use up to four times daily as directed. (FOR ICD-9 250.00, 250.01). 12/04/15   Waynetta Pean, PA-C  glyBURIDE (DIABETA) 2.5 MG tablet Take 1 tablet (2.5 mg total) by mouth daily with breakfast. For one week. Then 2 tablets (5 mg total) by mouth daily with breakfast. 12/04/15   Waynetta Pean, PA-C    Family History Family History  Problem Relation Age of Onset  . COPD Mother   . Congestive Heart Failure Father   . Diabetes Brother   . Irritable bowel syndrome      Constipation   . Colon cancer Paternal Grandmother 24    Social History Social History  Substance Use Topics  . Smoking status: Current Every Day Smoker    Packs/day: 1.00    Years: 20.00    Types: Cigarettes  . Smokeless tobacco: Never Used  . Alcohol use No     Allergies   Ambien [zolpidem]   Review of Systems Review of Systems  Constitutional: Negative for chills and fever.  HENT: Negative for congestion and sore throat.   Eyes: Positive  for visual disturbance.  Respiratory: Negative for cough and shortness of breath.   Cardiovascular: Negative for chest pain.  Gastrointestinal: Negative for abdominal pain, diarrhea, nausea and vomiting.  Endocrine: Positive for polydipsia and polyuria.  Genitourinary: Negative for difficulty urinating and dysuria.  Musculoskeletal: Negative for back pain and neck pain.  Skin: Negative for rash.  Neurological: Negative for dizziness, syncope, light-headedness and headaches.     Physical Exam Updated Vital Signs BP 125/71 (BP Location: Right Arm)   Pulse 82   Temp  98.2 F (36.8 C) (Oral)   Resp 18   Ht '5\' 8"'  (1.727 m)   Wt 112.9 kg   LMP 01/28/2012   SpO2 98%   BMI 37.86 kg/m   Physical Exam  Constitutional: She is oriented to person, place, and time. She appears well-developed and well-nourished. No distress.  Nontoxic appearing. Obese female.  HENT:  Head: Normocephalic and atraumatic.  Mouth/Throat: Oropharynx is clear and moist.  Mucous membranes are moist.   Eyes: Conjunctivae are normal. Pupils are equal, round, and reactive to light. Right eye exhibits no discharge. Left eye exhibits no discharge.  Neck: Neck supple.  Cardiovascular: Normal rate, regular rhythm, normal heart sounds and intact distal pulses.  Exam reveals no gallop and no friction rub.   No murmur heard. Pulmonary/Chest: Effort normal and breath sounds normal. No respiratory distress. She has no wheezes. She has no rales.  Abdominal: Soft. She exhibits no distension. There is no tenderness. There is no guarding.  Musculoskeletal: She exhibits no edema.  Lymphadenopathy:    She has no cervical adenopathy.  Neurological: She is alert and oriented to person, place, and time. Coordination normal.  Skin: Skin is warm and dry. Capillary refill takes less than 2 seconds. No rash noted. She is not diaphoretic. No erythema. No pallor.  Psychiatric: She has a normal mood and affect. Her behavior is normal.  Nursing note and vitals reviewed.    ED Treatments / Results  Labs (all labs ordered are listed, but only abnormal results are displayed) Labs Reviewed  BASIC METABOLIC PANEL - Abnormal; Notable for the following:       Result Value   Sodium 131 (*)    Chloride 96 (*)    Glucose, Bld 450 (*)    All other components within normal limits  CBC WITH DIFFERENTIAL/PLATELET - Abnormal; Notable for the following:    WBC 13.8 (*)    Hemoglobin 16.3 (*)    MCHC 36.9 (*)    Neutro Abs 10.1 (*)    All other components within normal limits  CBG MONITORING, ED - Abnormal;  Notable for the following:    Glucose-Capillary 457 (*)    All other components within normal limits  CBG MONITORING, ED - Abnormal; Notable for the following:    Glucose-Capillary 353 (*)    All other components within normal limits  URINALYSIS, ROUTINE W REFLEX MICROSCOPIC (NOT AT [facility])    EKG  EKG Interpretation None       Radiology No results found.  Procedures Procedures (including critical care time)  Medications Ordered in ED Medications  sodium chloride 0.9 % bolus 1,000 mL (0 mLs Intravenous Stopped 12/04/15 1649)  sodium chloride 0.9 % bolus 1,000 mL (1,000 mLs Intravenous New Bag/Given 12/04/15 1649)  insulin regular (NOVOLIN R,HUMULIN R) 100 units/mL injection 10 Units (10 Units Intravenous Given 12/04/15 1651)     Initial Impression / Assessment and Plan / ED Course  I have reviewed the triage vital signs  and the nursing notes.  Pertinent labs & imaging results that were available during my care of the patient were reviewed by me and considered in my medical decision making (see chart for details).  Clinical Course   Patient presented to emergency department from primary care doctor's office with hyperglycemia. Patient recently diagnosed with diabetes and was started on metformin. Patient never took any metformin because she reports she has side effects from this. She is not willing to try metformin. Blood sugar is 450 in the emergency department. Normal anion gap. No DKA. Patient received 2 L fluid bolus and 10 units of IV insulin. Within 30 minutes patient's sugar has improved to 350. I called and discussed with clinical pharmacist about second line medications to start on other than metformin. They suggested glyburide 2.5 mg once daily with breakfast. Will have her closely follow-up with her primary care doctor Daylene Posey. Patient also is requesting glucometer. I provided her prescription for glucometer that is being filled at the outpatient pharmacy here  today. I discussed exercise and diet to help with her diabetes. I discussed return precautions. I encouraged her to follow-up closely with Daylene Posey for further medication management for her diabetes. I advised the patient to follow-up with their primary care provider this week. I advised the patient to return to the emergency department with new or worsening symptoms or new concerns. The patient verbalized understanding and agreement with plan.    This patient was discussed with Dr. Venora Maples who agrees with assessment and plan.   Final Clinical Impressions(s) / ED Diagnoses   Final diagnoses:  Hyperglycemia  Diabetes mellitus, new onset ([facility])    New Prescriptions New Prescriptions   BLOOD GLUCOSE METER KIT AND SUPPLIES KIT    Dispense based on patient and insurance preference. Use up to four times daily as directed. (FOR ICD-9 250.00, 250.01).   GLYBURIDE (DIABETA) 2.5 MG TABLET    Take 1 tablet (2.5 mg total) by mouth daily with breakfast. For one week. Then 2 tablets (5 mg total) by mouth daily with breakfast.     Waynetta Pean, PA-C 12/04/15 [place], MD 12/04/15 1815

## 2017-09-30 ENCOUNTER — Emergency Department (HOSPITAL_COMMUNITY): Payer: 59

## 2017-09-30 ENCOUNTER — Encounter (HOSPITAL_COMMUNITY): Payer: Self-pay | Admitting: Emergency Medicine

## 2017-09-30 ENCOUNTER — Emergency Department (HOSPITAL_COMMUNITY)
Admission: EM | Admit: 2017-09-30 | Discharge: 2017-09-30 | Disposition: A | Discharge status: Home / Self Care | Payer: 59 | Attending: Emergency Medicine | Admitting: Emergency Medicine

## 2017-09-30 DIAGNOSIS — Y9301 Activity, walking, marching and hiking: Secondary | ICD-10-CM | POA: Insufficient documentation

## 2017-09-30 DIAGNOSIS — I10 Essential (primary) hypertension: Secondary | ICD-10-CM | POA: Diagnosis not present

## 2017-09-30 DIAGNOSIS — F17210 Nicotine dependence, cigarettes, uncomplicated: Secondary | ICD-10-CM | POA: Diagnosis not present

## 2017-09-30 DIAGNOSIS — S99921A Unspecified injury of right foot, initial encounter: Secondary | ICD-10-CM | POA: Diagnosis present

## 2017-09-30 DIAGNOSIS — W228XXA Striking against or struck by other objects, initial encounter: Secondary | ICD-10-CM | POA: Insufficient documentation

## 2017-09-30 DIAGNOSIS — E119 Type 2 diabetes mellitus without complications: Secondary | ICD-10-CM | POA: Insufficient documentation

## 2017-09-30 DIAGNOSIS — S92514A Nondisplaced fracture of proximal phalanx of right lesser toe(s), initial encounter for closed fracture: Secondary | ICD-10-CM | POA: Diagnosis not present

## 2017-09-30 DIAGNOSIS — Z7984 Long term (current) use of oral hypoglycemic drugs: Secondary | ICD-10-CM | POA: Insufficient documentation

## 2017-09-30 DIAGNOSIS — Y92009 Unspecified place in unspecified non-institutional (private) residence as the place of occurrence of the external cause: Secondary | ICD-10-CM | POA: Diagnosis not present

## 2017-09-30 DIAGNOSIS — Y998 Other external cause status: Secondary | ICD-10-CM | POA: Insufficient documentation

## 2017-09-30 NOTE — ED Notes (Signed)
Patient transported to X-ray 

## 2017-09-30 NOTE — ED Provider Notes (Signed)
[place] EMERGENCY DEPARTMENT Provider Note   CSN: [id number] Arrival date & time: 09/30/17  2482     History   Chief Complaint Chief Complaint  Patient presents with  . Toe Injury    HPI Lisa [name] is a 47 y.o. female with history of diabetes, hypertension, IBS who presents with right third and fourth toe pain after hitting her foot on the bed last night around 10 PM.  She denies any other injuries.  She has been able to walk, but with pain.  She iced and elevated at home.  She did not take any medications prior to arrival.  She denies any numbness or tingling.  HPI  Past Medical History:  Diagnosis Date  . Diabetes mellitus without complication ([place])   . Diverticulitis   . Hypertension   . IBS (irritable bowel syndrome)     Patient Active Problem List   Diagnosis Date Noted  . Acute diverticulitis 03/11/2015  . Leukocytosis 03/11/2015  . Tachycardia 03/11/2015  . Tobacco use 03/11/2015  . Obesity 03/11/2015  . Diabetes mellitus without complication ([place])   . Hypertension   . IBS (irritable bowel syndrome)     Past Surgical History:  Procedure Laterality Date  . ABDOMINAL HYSTERECTOMY       OB History   None      Home Medications    Prior to Admission medications   Medication Sig Start Date End Date Taking? Authorizing Provider  blood glucose meter kit and supplies KIT Dispense based on patient and insurance preference. Use up to four times daily as directed. (FOR ICD-9 250.00, 250.01). 12/04/15   Waynetta Pean, PA-C  glyBURIDE (DIABETA) 2.5 MG tablet Take 1 tablet (2.5 mg total) by mouth daily with breakfast. For one week. Then 2 tablets (5 mg total) by mouth daily with breakfast. 12/04/15   Waynetta Pean, PA-C    Family History Family History  Problem Relation Age of Onset  . COPD Mother   . Congestive Heart Failure Father   . Diabetes Brother   . Irritable bowel syndrome Unknown        Constipation   . Colon cancer  Paternal Grandmother 22    Social History Social History   Tobacco Use  . Smoking status: Current Every Day Smoker    Packs/day: 1.00    Years: 20.00    Pack years: 20.00    Types: Cigarettes  . Smokeless tobacco: Never Used  Substance Use Topics  . Alcohol use: No    Alcohol/week: 0.0 oz  . Drug use: No     Allergies   Ambien [zolpidem]   Review of Systems Review of Systems  Musculoskeletal: Positive for arthralgias.  Neurological: Negative for numbness.     Physical Exam Updated Vital Signs BP (!) 151/96 (BP Location: Right Arm)   Pulse 92   Temp 98.7 F (37.1 C) (Oral)   Resp 16   Ht '5\' 8"'  (1.727 m)   Wt 112.5 kg (248 lb)   LMP 01/28/2012   SpO2 99%   BMI 37.71 kg/m   Physical Exam  Constitutional: She appears well-developed and well-nourished. No distress.  HENT:  Head: Normocephalic and atraumatic.  Mouth/Throat: Oropharynx is clear and moist. No oropharyngeal exudate.  Eyes: Pupils are equal, round, and reactive to light. Conjunctivae are normal. Right eye exhibits no discharge. Left eye exhibits no discharge. No scleral icterus.  Neck: Normal range of motion. Neck supple.  Cardiovascular: Normal rate, regular rhythm, normal heart sounds and  intact distal pulses. Exam reveals no gallop and no friction rub.  No murmur heard. Pulmonary/Chest: Effort normal and breath sounds normal. No stridor. No respiratory distress. She has no wheezes. She has no rales.  Abdominal: Soft. Bowel sounds are normal. She exhibits no distension. There is no tenderness. There is no rebound and no guarding.  Musculoskeletal: She exhibits no edema.       Right ankle: Normal.       Right foot: There is tenderness and bony tenderness. There is no swelling (no significant noted ), normal capillary refill, no deformity and no laceration.       Feet:  Neurological: She is alert. Coordination normal.  Skin: Skin is warm and dry. No rash noted. She is not diaphoretic. No pallor.    Psychiatric: She has a normal mood and affect.  Nursing note and vitals reviewed.    ED Treatments / Results  Labs (all labs ordered are listed, but only abnormal results are displayed) Labs Reviewed - No data to display  EKG None  Radiology Dg Foot Complete Right  Result Date: 09/30/2017 CLINICAL DATA:  Struck foot on bed 2 nights ago, third and fourth toe pain. EXAM: RIGHT FOOT COMPLETE - 3+ VIEW COMPARISON:  None. FINDINGS: Acute nondisplaced fourth proximal phalanx distal aspect fracture without intra-articular extension only apparent on AP view. No dislocation. No destructive bony lesions. Soft tissue planes are non suspicious. IMPRESSION: Acute nondisplaced fourth proximal phalanx fracture seen on single view. Electronically Signed   By: Elon Alas M.D.   On: 09/30/2017 06:42    Procedures Procedures (including critical care time)  Medications Ordered in ED Medications - No data to display   Initial Impression / Assessment and Plan / ED Course  I have reviewed the triage vital signs and the nursing notes.  Pertinent labs & imaging results that were available during my care of the patient were reviewed by me and considered in my medical decision making (see chart for details).     Patient with acute nondisplaced fourth proximal phalanx fracture on the right foot.  Neurovascularly intact.  Patient placed in postop shoe in the third and fourth toes buddy taped nursing.  Advised ice and elevation as well as ibuprofen and Tylenol as needed for pain.  Follow-up to PCP.  Patient advised to wear buddy taping and postop shoe for 3 weeks.  Return precautions discussed.  Patient understands and agrees with plan.  Patient vitals stable throughout ED course and discharged in satisfactory condition.  Final Clinical Impressions(s) / ED Diagnoses   Final diagnoses:  Closed nondisplaced fracture of proximal phalanx of lesser toe of right foot, initial encounter    ED Discharge  Orders    None       Frederica Kuster, PA-C 53/00/51 1021    Delora Fuel, MD 11/73/56 [phone]

## 2017-09-30 NOTE — ED Triage Notes (Signed)
Pt reports stubbing toe on bed last night and pain in right 3rd and 4th toe. Reports icing her toe and elevating last night, unable to put much weight on foot this AM.

## 2017-09-30 NOTE — Discharge Instructions (Signed)
Keep your 3rd and 4th toes taped together for 3 weeks. Wear your post op shoe at all times, especially when walking or bearing weight, for comfort. Use ice 3-4 times daily alternating 15 min on, 15 min off. Elevate your foot when you are not walking on it. Please follow up with your doctor as needed. You can alternate with Tylenol and ibuprofen, as prescribed over-the-counter, as needed for pain. Please return to the emergency department if you develop any new or worsening symptoms.

## 2018-02-26 ENCOUNTER — Emergency Department (HOSPITAL_COMMUNITY)
Admission: EM | Admit: 2018-02-26 | Discharge: 2018-02-27 | Disposition: A | Discharge status: Home / Self Care | Payer: 59 | Attending: Emergency Medicine | Admitting: Emergency Medicine

## 2018-02-26 ENCOUNTER — Other Ambulatory Visit: Payer: Self-pay

## 2018-02-26 ENCOUNTER — Encounter (HOSPITAL_COMMUNITY): Payer: Self-pay | Admitting: *Deleted

## 2018-02-26 ENCOUNTER — Emergency Department (HOSPITAL_COMMUNITY): Payer: 59

## 2018-02-26 DIAGNOSIS — Z794 Long term (current) use of insulin: Secondary | ICD-10-CM | POA: Diagnosis not present

## 2018-02-26 DIAGNOSIS — F17210 Nicotine dependence, cigarettes, uncomplicated: Secondary | ICD-10-CM | POA: Diagnosis not present

## 2018-02-26 DIAGNOSIS — E119 Type 2 diabetes mellitus without complications: Secondary | ICD-10-CM | POA: Insufficient documentation

## 2018-02-26 DIAGNOSIS — I160 Hypertensive urgency: Secondary | ICD-10-CM | POA: Insufficient documentation

## 2018-02-26 DIAGNOSIS — R51 Headache: Secondary | ICD-10-CM | POA: Diagnosis present

## 2018-02-26 DIAGNOSIS — Z79899 Other long term (current) drug therapy: Secondary | ICD-10-CM | POA: Insufficient documentation

## 2018-02-26 LAB — DIFFERENTIAL
Basophils Absolute: 0.2 10*3/uL — ABNORMAL HIGH (ref 0.0–0.1)
Basophils Relative: 1 %
EOS ABS: 0.3 10*3/uL (ref 0.0–0.5)
Eosinophils Relative: 2 %
LYMPHS ABS: 4.4 10*3/uL — AB (ref 0.7–4.0)
Lymphocytes Relative: 28 %
MONO ABS: 0.8 10*3/uL (ref 0.1–1.0)
MONOS PCT: 5 %
NRBC: 0 /100{WBCs}
Neutro Abs: 10 10*3/uL — ABNORMAL HIGH (ref 1.7–7.7)
Neutrophils Relative %: 64 %

## 2018-02-26 LAB — URINALYSIS, ROUTINE W REFLEX MICROSCOPIC
Bilirubin Urine: NEGATIVE
Glucose, UA: NEGATIVE mg/dL
Ketones, ur: NEGATIVE mg/dL
Leukocytes, UA: NEGATIVE
Nitrite: NEGATIVE
Protein, ur: NEGATIVE mg/dL
Specific Gravity, Urine: 1.01 (ref 1.005–1.030)
pH: 5 (ref 5.0–8.0)

## 2018-02-26 LAB — I-STAT TROPONIN, ED: Troponin i, poc: 0 ng/mL (ref 0.00–0.08)

## 2018-02-26 LAB — CBC
HCT: 46.8 % — ABNORMAL HIGH (ref 36.0–46.0)
HEMOGLOBIN: 15.3 g/dL — AB (ref 12.0–15.0)
MCH: 29.8 pg (ref 26.0–34.0)
MCHC: 32.7 g/dL (ref 30.0–36.0)
MCV: 91.1 fL (ref 80.0–100.0)
Platelets: 401 10*3/uL — ABNORMAL HIGH (ref 150–400)
RBC: 5.14 MIL/uL — AB (ref 3.87–5.11)
RDW: 13.5 % (ref 11.5–15.5)
WBC: 15.6 10*3/uL — ABNORMAL HIGH (ref 4.0–10.5)
nRBC: 0 % (ref 0.0–0.2)

## 2018-02-26 LAB — COMPREHENSIVE METABOLIC PANEL
ALT: 27 U/L (ref 0–44)
AST: 21 U/L (ref 15–41)
Albumin: 4.1 g/dL (ref 3.5–5.0)
Alkaline Phosphatase: 95 U/L (ref 38–126)
Anion gap: 9 (ref 5–15)
BILIRUBIN TOTAL: 0.6 mg/dL (ref 0.3–1.2)
BUN: 8 mg/dL (ref 6–20)
CO2: 20 mmol/L — ABNORMAL LOW (ref 22–32)
CREATININE: 0.76 mg/dL (ref 0.44–1.00)
Calcium: 9.6 mg/dL (ref 8.9–10.3)
Chloride: 109 mmol/L (ref 98–111)
Glucose, Bld: 134 mg/dL — ABNORMAL HIGH (ref 70–99)
POTASSIUM: 3.8 mmol/L (ref 3.5–5.1)
Sodium: 138 mmol/L (ref 135–145)
TOTAL PROTEIN: 7.4 g/dL (ref 6.5–8.1)

## 2018-02-26 LAB — PROTIME-INR
INR: 0.93
Prothrombin Time: 12.4 seconds (ref 11.4–15.2)

## 2018-02-26 LAB — APTT: aPTT: 30 seconds (ref 24–36)

## 2018-02-26 MED ORDER — IBUPROFEN 400 MG PO TABS
400.0000 mg | ORAL_TABLET | Freq: Once | ORAL | Status: DC
  Filled 2018-02-26: qty 1

## 2018-02-26 NOTE — ED Triage Notes (Signed)
Pt reports having hx of htn. Had bp checked at work two days ago and it was elevated, pt sent home. Reports today having HTN with headache and left side weakness that started at 12:30. Having nausea at triage. Grip mildly weaker, no arm drift noted, speech is clear.

## 2018-02-27 MED ORDER — LOSARTAN POTASSIUM 100 MG PO TABS: 100.0000 mg | ORAL_TABLET | Freq: Every day | ORAL | 0 refills | Status: DC

## 2018-02-27 NOTE — ED Provider Notes (Signed)
[facility] EMERGENCY DEPARTMENT Provider Note   CSN: [id number] Arrival date & time: 02/26/18  2028     History   Chief Complaint Chief Complaint  Patient presents with  . Hypertension  . Headache    HPI Lisa [name] is a 47 y.o. female.  HPI  Presents with 3 days of feeling different from a normal. She has been history of hypertension, diabetes, notes that she has had repeated elevated blood pressure readings over the past 3 days, and today has had headache, with possible left-sided weakness, that began about 8 hours prior to ED arrival. She has some nausea, but no vomiting, no discoordination, no falling, no gait difficulty. She is here with her daughter who assists with the HPI. Patient takes 3 antihypertensive, and recently switched from oral antihyperglycemic to insulin.  Past Medical History:  Diagnosis Date  . Diabetes mellitus without complication ([place])   . Diverticulitis   . Hypertension   . IBS (irritable bowel syndrome)     Patient Active Problem List   Diagnosis Date Noted  . Acute diverticulitis 03/11/2015  . Leukocytosis 03/11/2015  . Tachycardia 03/11/2015  . Tobacco use 03/11/2015  . Obesity 03/11/2015  . Diabetes mellitus without complication ([place])   . Hypertension   . IBS (irritable bowel syndrome)     Past Surgical History:  Procedure Laterality Date  . ABDOMINAL HYSTERECTOMY       OB History   None      Home Medications    Prior to Admission medications   Medication Sig Start Date End Date Taking? Authorizing Provider  amLODipine (NORVASC) 10 MG tablet Take 10 mg by mouth daily. 12/22/17  Yes [provider]  blood glucose meter kit and supplies KIT Dispense based on patient and insurance preference. Use up to four times daily as directed. (FOR ICD-9 250.00, 250.01). 12/04/15  Yes Waynetta Pean, PA-C  ibuprofen (ADVIL,MOTRIN) 200 MG tablet Take 200-400 mg by mouth every 6 (six) hours as needed for  headache, mild pain or moderate pain.    Yes [provider]  Insulin Glargine (BASAGLAR KWIKPEN) 100 UNIT/ML SOPN Inject 25 Units into the skin daily before breakfast. 01/01/18  Yes [provider]  lubiprostone (AMITIZA) 24 MCG capsule Take 24 mcg by mouth daily as needed for constipation.  06/04/17  Yes [provider]  metoprolol tartrate (LOPRESSOR) 100 MG tablet Take 100 mg by mouth 2 (two) times daily. 04/17/16  Yes [provider]  OZEMPIC, 0.25 OR 0.5 MG/DOSE, 2 MG/1.5ML SOPN Inject 0.2 mLs into the skin every Saturday.  12/11/17  Yes [provider]  glyBURIDE (DIABETA) 2.5 MG tablet Take 1 tablet (2.5 mg total) by mouth daily with breakfast. For one week. Then 2 tablets (5 mg total) by mouth daily with breakfast. Patient not taking: Reported on 02/26/2018 12/04/15   Waynetta Pean, PA-C  losartan (COZAAR) 100 MG tablet Take 1 tablet (100 mg total) by mouth daily. 02/27/18   Carmin Muskrat, MD    Family History Family History  Problem Relation Age of Onset  . COPD Mother   . Congestive Heart Failure Father   . Diabetes Brother   . Irritable bowel syndrome Unknown        Constipation   . Colon cancer Paternal Grandmother 53    Social History Social History   Tobacco Use  . Smoking status: Current Every Day Smoker    Packs/day: 1.00    Years: 20.00    Pack years: 20.00  Types: Cigarettes  . Smokeless tobacco: Never Used  Substance Use Topics  . Alcohol use: No    Alcohol/week: 0.0 standard drinks  . Drug use: No     Allergies   Flu virus vaccine and Zolpidem   Review of Systems Review of Systems  Constitutional:       Per HPI, otherwise negative  HENT:       Per HPI, otherwise negative  Respiratory:       Per HPI, otherwise negative  Cardiovascular:       Per HPI, otherwise negative  Gastrointestinal: Negative for vomiting.  Endocrine:       Negative aside from HPI  Genitourinary:       Neg aside from HPI     Musculoskeletal:       Per HPI, otherwise negative  Skin: Negative.   Neurological: Positive for weakness and headaches. Negative for syncope.     Physical Exam Updated Vital Signs BP (!) 149/106   Pulse 85   Temp 98.6 F (37 C) (Oral)   Resp 16   LMP 01/28/2012   SpO2 99%   Physical Exam  Constitutional: She is oriented to person, place, and time. She appears well-developed and well-nourished. No distress.  HENT:  Head: Normocephalic and atraumatic.  Eyes: Conjunctivae and EOM are normal.  Cardiovascular: Normal rate and regular rhythm.  Pulmonary/Chest: Effort normal and breath sounds normal. No stridor. No respiratory distress.  Abdominal: She exhibits no distension.  Musculoskeletal: She exhibits no edema.  Neurological: She is alert and oriented to person, place, and time. She displays no atrophy and no tremor. No cranial nerve deficit. She exhibits normal muscle tone. She displays no seizure activity. Coordination normal.  Patient describes sensation of weakness in her left side, but has no appreciable objective deficiencies left versus right upper or lower extremities. Speech difficulty, no cranial nerve deficits.   Skin: Skin is warm and dry.  Psychiatric: She has a normal mood and affect.  Nursing note and vitals reviewed.    ED Treatments / Results  Labs (all labs ordered are listed, but only abnormal results are displayed) Labs Reviewed  CBC - Abnormal; Notable for the following components:      Result Value   WBC 15.6 (*)    RBC 5.14 (*)    Hemoglobin 15.3 (*)    HCT 46.8 (*)    Platelets 401 (*)    All other components within normal limits  DIFFERENTIAL - Abnormal; Notable for the following components:   Neutro Abs 10.0 (*)    Lymphs Abs 4.4 (*)    Basophils Absolute 0.2 (*)    All other components within normal limits  COMPREHENSIVE METABOLIC PANEL - Abnormal; Notable for the following components:   CO2 20 (*)    Glucose, Bld 134 (*)    All  other components within normal limits  URINALYSIS, ROUTINE W REFLEX MICROSCOPIC - Abnormal; Notable for the following components:   Hgb urine dipstick LARGE (*)    Bacteria, UA RARE (*)    All other components within normal limits  PROTIME-INR  APTT  I-STAT TROPONIN, ED    EKG EKG Interpretation  Date/Time:  Thursday February 26 2018 20:57:54 EST Ventricular Rate:  94 PR Interval:  170 QRS Duration: 88 QT Interval:  366 QTC Calculation: 457 R Axis:   54 Text Interpretation:  Normal sinus rhythm Nonspecific ST abnormality Abnormal ECG Abnormal ekg Confirmed by Carmin Muskrat [phone]) on 02/26/2018 10:14:07 PM   Radiology Ct  Head Wo Contrast  Result Date: 02/26/2018 CLINICAL DATA:  Headache for 2 days.  Left-sided weakness. EXAM: CT HEAD WITHOUT CONTRAST TECHNIQUE: Contiguous axial images were obtained from the base of the skull through the vertex without intravenous contrast. COMPARISON:  December 11, 2012 FINDINGS: Brain: No subdural, epidural, or subarachnoid hemorrhage. Cerebellum, brainstem, and basal cisterns are normal. Ventricles and sulci are normal. No acute cortical ischemia or infarct. No mass effect or midline shift. Vascular: No hyperdense vessel or unexpected calcification. Skull: Normal. Negative for fracture or focal lesion. Sinuses/Orbits: No acute finding. Other: None. IMPRESSION: 1. No acute intracranial abnormalities. Electronically Signed   By: Dorise Bullion III M.D   On: 02/26/2018 21:53    Procedures Procedures (including critical care time)  Medications Ordered in ED Medications  ibuprofen (ADVIL,MOTRIN) tablet 400 mg (400 mg Oral Not Given 02/26/18 2304)     Initial Impression / Assessment and Plan / ED Course  I have reviewed the triage vital signs and the nursing notes.  Pertinent labs & imaging results that were available during my care of the patient were reviewed by me and considered in my medical decision making (see chart for details).  12:28  AM On repeat exam the patient is awake alert, blood pressure has diminished, from presentation value 170/120 now 150/105. Patient has no new complaints, is awake alert, with no notable deficiency. We discussed at length reassuring findings on CT, labs, EKG, no current evidence for stroke. We discussed overnight monitoring, consideration of inpatient MRI versus outpatient follow-up. Patient has strong preference for the latter, given the absence of neuro deficits, this is reasonable. Patient will have adjustment in her antihypertensive regimen, will start aspirin for stroke prophylaxis, will follow up with primary care within 1 week for repeat evaluation.   Final Clinical Impressions(s) / ED Diagnoses   Final diagnoses:  Hypertensive urgency    ED Discharge Orders         Ordered    losartan (COZAAR) 100 MG tablet  Daily     02/27/18 0005           Carmin Muskrat, MD 02/27/18 [phone]

## 2018-02-27 NOTE — Discharge Instructions (Addendum)
As discussed, your evaluation today has been largely reassuring.  But, it is important that you monitor your condition carefully, and do not hesitate to return to the ED if you develop new, or concerning changes in your condition.  Otherwise, please follow-up with your physician for appropriate ongoing care.  Your blood pressure medication regimen has been altered, please take your new dose of Cozaar, as prescribed and discuss this with your physician when you see her next week.  With consideration of your weakness and headache, for further reduction of your stroke risk, please begin taking 81 mg aspirin daily.

## 2019-08-17 ENCOUNTER — Inpatient Hospital Stay (HOSPITAL_COMMUNITY)
Admission: EM | Admit: 2019-08-17 | Discharge: 2019-08-20 | DRG: 439 | Disposition: A | Discharge status: Home / Self Care | Payer: 59 | Attending: Internal Medicine | Admitting: Internal Medicine

## 2019-08-17 ENCOUNTER — Emergency Department (HOSPITAL_COMMUNITY): Payer: 59

## 2019-08-17 ENCOUNTER — Encounter (HOSPITAL_COMMUNITY): Payer: Self-pay | Admitting: Emergency Medicine

## 2019-08-17 DIAGNOSIS — I10 Essential (primary) hypertension: Secondary | ICD-10-CM | POA: Diagnosis present

## 2019-08-17 DIAGNOSIS — Z79899 Other long term (current) drug therapy: Secondary | ICD-10-CM

## 2019-08-17 DIAGNOSIS — E119 Type 2 diabetes mellitus without complications: Secondary | ICD-10-CM

## 2019-08-17 DIAGNOSIS — Z20822 Contact with and (suspected) exposure to covid-19: Secondary | ICD-10-CM | POA: Diagnosis present

## 2019-08-17 DIAGNOSIS — K760 Fatty (change of) liver, not elsewhere classified: Secondary | ICD-10-CM | POA: Diagnosis present

## 2019-08-17 DIAGNOSIS — K581 Irritable bowel syndrome with constipation: Secondary | ICD-10-CM | POA: Diagnosis present

## 2019-08-17 DIAGNOSIS — Z794 Long term (current) use of insulin: Secondary | ICD-10-CM

## 2019-08-17 DIAGNOSIS — Z888 Allergy status to other drugs, medicaments and biological substances status: Secondary | ICD-10-CM

## 2019-08-17 DIAGNOSIS — R519 Headache, unspecified: Secondary | ICD-10-CM | POA: Diagnosis present

## 2019-08-17 DIAGNOSIS — Z825 Family history of asthma and other chronic lower respiratory diseases: Secondary | ICD-10-CM

## 2019-08-17 DIAGNOSIS — Z800 Family history of malignant neoplasm of digestive organs: Secondary | ICD-10-CM

## 2019-08-17 DIAGNOSIS — Z887 Allergy status to serum and vaccine status: Secondary | ICD-10-CM

## 2019-08-17 DIAGNOSIS — Z87891 Personal history of nicotine dependence: Secondary | ICD-10-CM

## 2019-08-17 DIAGNOSIS — K859 Acute pancreatitis without necrosis or infection, unspecified: Principal | ICD-10-CM | POA: Diagnosis present

## 2019-08-17 DIAGNOSIS — R1011 Right upper quadrant pain: Secondary | ICD-10-CM

## 2019-08-17 DIAGNOSIS — E669 Obesity, unspecified: Secondary | ICD-10-CM | POA: Diagnosis present

## 2019-08-17 DIAGNOSIS — Z6838 Body mass index (BMI) 38.0-38.9, adult: Secondary | ICD-10-CM

## 2019-08-17 DIAGNOSIS — Z833 Family history of diabetes mellitus: Secondary | ICD-10-CM

## 2019-08-17 DIAGNOSIS — K5792 Diverticulitis of intestine, part unspecified, without perforation or abscess without bleeding: Secondary | ICD-10-CM | POA: Diagnosis present

## 2019-08-17 DIAGNOSIS — Z8249 Family history of ischemic heart disease and other diseases of the circulatory system: Secondary | ICD-10-CM

## 2019-08-17 LAB — LIPASE, BLOOD: Lipase: 208 U/L — ABNORMAL HIGH (ref 11–51)

## 2019-08-17 LAB — URINALYSIS, ROUTINE W REFLEX MICROSCOPIC
Bilirubin Urine: NEGATIVE
Glucose, UA: NEGATIVE mg/dL
Ketones, ur: NEGATIVE mg/dL
Leukocytes,Ua: NEGATIVE
Nitrite: NEGATIVE
Protein, ur: NEGATIVE mg/dL
Specific Gravity, Urine: 1.008 (ref 1.005–1.030)
pH: 5 (ref 5.0–8.0)

## 2019-08-17 LAB — COMPREHENSIVE METABOLIC PANEL
ALT: 18 U/L (ref 0–44)
AST: 15 U/L (ref 15–41)
Albumin: 3.9 g/dL (ref 3.5–5.0)
Alkaline Phosphatase: 105 U/L (ref 38–126)
Anion gap: 8 (ref 5–15)
BUN: 8 mg/dL (ref 6–20)
CO2: 21 mmol/L — ABNORMAL LOW (ref 22–32)
Calcium: 9 mg/dL (ref 8.9–10.3)
Chloride: 105 mmol/L (ref 98–111)
Creatinine, Ser: 0.79 mg/dL (ref 0.44–1.00)
GFR calc Af Amer: >60 mL/min (ref >60)
GFR calc non Af Amer: >60 mL/min (ref >60)
Glucose, Bld: 276 mg/dL — ABNORMAL HIGH (ref 70–99)
Potassium: 3.9 mmol/L (ref 3.5–5.1)
Sodium: 134 mmol/L — ABNORMAL LOW (ref 135–145)
Total Bilirubin: 0.7 mg/dL (ref 0.3–1.2)
Total Protein: 6.5 g/dL (ref 6.5–8.1)

## 2019-08-17 LAB — I-STAT BETA HCG BLOOD, ED (MC, WL, AP ONLY): I-stat hCG, quantitative: <5 m[IU]/mL (ref <5)

## 2019-08-17 LAB — CBC
HCT: 42.7 % (ref 36.0–46.0)
Hemoglobin: 14.8 g/dL (ref 12.0–15.0)
MCH: 31.9 pg (ref 26.0–34.0)
MCHC: 34.7 g/dL (ref 30.0–36.0)
MCV: 92 fL (ref 80.0–100.0)
Platelets: 349 10*3/uL (ref 150–400)
RBC: 4.64 MIL/uL (ref 3.87–5.11)
RDW: 13.2 % (ref 11.5–15.5)
WBC: 14.7 10*3/uL — ABNORMAL HIGH (ref 4.0–10.5)
nRBC: 0 % (ref 0.0–0.2)

## 2019-08-17 MED ORDER — IOHEXOL 300 MG/ML  SOLN
100.0000 mL | Freq: Once | INTRAMUSCULAR | Status: AC | PRN
  Administered 2019-08-17: 100 mL via INTRAVENOUS

## 2019-08-17 MED ORDER — SODIUM CHLORIDE 0.9% FLUSH: 3.0000 mL | Freq: Once | INTRAVENOUS | Status: DC

## 2019-08-17 MED ORDER — HYDROMORPHONE HCL 1 MG/ML IJ SOLN
1.0000 mg | Freq: Once | INTRAMUSCULAR | Status: AC
  Administered 2019-08-17: 21:00:00 1 mg via INTRAVENOUS
  Filled 2019-08-17: qty 1

## 2019-08-17 MED ORDER — SODIUM CHLORIDE 0.9 % IV BOLUS
1000.0000 mL | Freq: Once | INTRAVENOUS | Status: AC
  Administered 2019-08-17: 1000 mL via INTRAVENOUS

## 2019-08-17 MED ORDER — HYDROMORPHONE HCL 1 MG/ML IJ SOLN: 1.0000 mg | Freq: Once | INTRAMUSCULAR | Status: DC

## 2019-08-17 MED ORDER — ONDANSETRON HCL 4 MG/2ML IJ SOLN
4.0000 mg | Freq: Once | INTRAMUSCULAR | Status: AC
  Administered 2019-08-17: 21:00:00 4 mg via INTRAVENOUS
  Filled 2019-08-17: qty 2

## 2019-08-17 MED ORDER — HYDROMORPHONE HCL 1 MG/ML IJ SOLN
1.0000 mg | Freq: Once | INTRAMUSCULAR | Status: AC
  Administered 2019-08-18: 1 mg via INTRAVENOUS
  Filled 2019-08-17: qty 1

## 2019-08-17 NOTE — ED Provider Notes (Signed)
[facility] EMERGENCY DEPARTMENT Provider Note   CSN: [id number] Arrival date & time: 08/17/19  1738     History Chief Complaint  Patient presents with   Abdominal Pain    Lisa [name] is a 49 y.o. female with history of insulin-dependent diabetes, hypertension, diverticulitis, IBS with constipation who presents with right upper quadrant abdominal pain.  She dates she got off of work last night and started to have some mild right upper quadrant pain.  The pain woke her up this morning because it was more severe in nature.  She has difficulty describing it and states it just feels like a pain.  Pain is constant but waxing and waning in severity.  Is radiating to her back.  She has never had this before.  Nothing makes it better.  Eating makes her feel more nauseous and have more pain but she has not had any vomiting.  She has not had a bowel movement since Friday.  She denies any urinary symptoms.  Prior surgical history significant for hysterectomy. She denies ETOH use.  HPI     Past Medical History:  Diagnosis Date   Diabetes mellitus without complication ([place])    Diverticulitis    Hypertension    IBS (irritable bowel syndrome)     Patient Active Problem List   Diagnosis Date Noted   Acute diverticulitis 03/11/2015   Leukocytosis 03/11/2015   Tachycardia 03/11/2015   Tobacco use 03/11/2015   Obesity 03/11/2015   Diabetes mellitus without complication ([facility])    Hypertension    IBS (irritable bowel syndrome)     Past Surgical History:  Procedure Laterality Date   ABDOMINAL HYSTERECTOMY       OB History   No obstetric history on file.     Family History  Problem Relation Age of Onset   COPD Mother    Congestive Heart Failure Father    Diabetes Brother    Irritable bowel syndrome Other        Constipation    Colon cancer Paternal Grandmother 10    Social History   Tobacco Use   Smoking status: Current Every Day Smoker    Packs/day: 1.00    Years: 20.00    Pack years: 20.00    Types: Cigarettes   Smokeless tobacco: Never Used  Substance Use Topics   Alcohol use: No    Alcohol/week: 0.0 standard drinks   Drug use: No    Home Medications Prior to Admission medications   Medication Sig Start Date End Date Taking? Authorizing Provider  amLODipine (NORVASC) 10 MG tablet Take 10 mg by mouth daily. 12/22/17   [provider]  blood glucose meter kit and supplies KIT Dispense based on patient and insurance preference. Use up to four times daily as directed. (FOR ICD-9 250.00, 250.01). 12/04/15   Waynetta Pean, PA-C  glyBURIDE (DIABETA) 2.5 MG tablet Take 1 tablet (2.5 mg total) by mouth daily with breakfast. For one week. Then 2 tablets (5 mg total) by mouth daily with breakfast. Patient not taking: Reported on 02/26/2018 12/04/15   Waynetta Pean, PA-C  ibuprofen (ADVIL,MOTRIN) 200 MG tablet Take 200-400 mg by mouth every 6 (six) hours as needed for headache, mild pain or moderate pain.     [provider]  Insulin Glargine (BASAGLAR KWIKPEN) 100 UNIT/ML SOPN Inject 25 Units into the skin daily before breakfast. 01/01/18   [provider]  losartan (COZAAR) 100 MG tablet Take 1 tablet (100 mg total) by mouth daily.  02/27/18   Carmin Muskrat, MD  lubiprostone (AMITIZA) 24 MCG capsule Take 24 mcg by mouth daily as needed for constipation.  06/04/17   [provider]  metoprolol tartrate (LOPRESSOR) 100 MG tablet Take 100 mg by mouth 2 (two) times daily. 04/17/16   [provider]  OZEMPIC, 0.25 OR 0.5 MG/DOSE, 2 MG/1.5ML SOPN Inject 0.2 mLs into the skin every Saturday.  12/11/17   [provider]    Allergies    Flu virus vaccine and Zolpidem  Review of Systems   Review of Systems  Constitutional: Negative for chills and fever.  Respiratory: Negative for shortness of breath.   Cardiovascular: Negative for chest pain.  Gastrointestinal: Positive for  abdominal pain, constipation and nausea. Negative for diarrhea and vomiting.  Genitourinary: Negative for dysuria and flank pain.  All other systems reviewed and are negative.   Physical Exam Updated Vital Signs BP (!) 158/97 (BP Location: Right Arm)    Pulse 94    Temp 99 F (37.2 C) (Oral)    Resp 20    LMP 01/28/2012    SpO2 100%   Physical Exam Vitals and nursing note reviewed.  Constitutional:      General: She is not in acute distress.    Appearance: She is well-developed. She is not ill-appearing.     Comments: Uncomfortable appearing, mild distress due to pain  HENT:     Head: Normocephalic and atraumatic.  Eyes:     General: No scleral icterus.       Right eye: No discharge.        Left eye: No discharge.     Conjunctiva/sclera: Conjunctivae normal.     Pupils: Pupils are equal, round, and reactive to light.  Cardiovascular:     Rate and Rhythm: Normal rate.  Pulmonary:     Effort: Pulmonary effort is normal. No respiratory distress.  Abdominal:     General: Abdomen is protuberant. Bowel sounds are normal. There is no distension.     Palpations: Abdomen is soft.     Tenderness: There is abdominal tenderness (mostly in RUQ) in the right upper quadrant, right lower quadrant and epigastric area. There is guarding.     Hernia: No hernia is present.  Musculoskeletal:     Cervical back: Normal range of motion.  Skin:    General: Skin is warm and dry.  Neurological:     Mental Status: She is alert and oriented to person, place, and time.  Psychiatric:        Behavior: Behavior normal.     ED Results / Procedures / Treatments   Labs (all labs ordered are listed, but only abnormal results are displayed) Labs Reviewed  LIPASE, BLOOD - Abnormal; Notable for the following components:      Result Value   Lipase 208 (*)    All other components within normal limits  COMPREHENSIVE METABOLIC PANEL - Abnormal; Notable for the following components:   Sodium 134 (*)    CO2  21 (*)    Glucose, Bld 276 (*)    All other components within normal limits  CBC - Abnormal; Notable for the following components:   WBC 14.7 (*)    All other components within normal limits  URINALYSIS, ROUTINE W REFLEX MICROSCOPIC - Abnormal; Notable for the following components:   Hgb urine dipstick MODERATE (*)    Bacteria, UA RARE (*)    All other components within normal limits  I-STAT BETA HCG BLOOD, ED ([facility], [facility],  [facility] ONLY)    EKG None  Radiology CT Abdomen Pelvis W Contrast  Result Date: 08/17/2019 CLINICAL DATA:  Right upper quadrant pain EXAM: CT ABDOMEN AND PELVIS WITH CONTRAST TECHNIQUE: Multidetector CT imaging of the abdomen and pelvis was performed using the standard protocol following bolus administration of intravenous contrast. CONTRAST:  122m OMNIPAQUE IOHEXOL 300 MG/ML  SOLN COMPARISON:  Ultrasound 08/17/2019, CT 04/14/2015 FINDINGS: Lower chest: No acute abnormality. Hepatobiliary: Hepatic steatosis. No calcified gallstone or biliary dilatation. Pancreas: No ductal enlargement. Edema and fluid at the pancreaticoduodenal groove. Spleen: Normal in size without focal abnormality. Adrenals/Urinary Tract: Adrenal glands are unremarkable. Kidneys are normal, without renal calculi, focal lesion, or hydronephrosis. Bladder is unremarkable. Stomach/Bowel: Stomach is nonenlarged. Fluid and edema surrounding the second and third portion of duodenum. No extraluminal gas collection. Vascular/Lymphatic: No significant vascular findings are present. No enlarged abdominal or pelvic lymph nodes. Reproductive: Status post hysterectomy. No adnexal masses. Other: No free air Musculoskeletal: No acute or significant osseous findings. IMPRESSION: 1. Edema and fluid at the pancreaticoduodenal groove and surrounding the second and third portion of duodenum. Findings could be secondary to pancreatitis versus duodenitis. Recommend correlation with appropriate enzymes. 2. Hepatic steatosis Electronically  Signed   By: KDonavan FoilM.D.   On: 08/17/2019 23:52   [place]Abdomen Limited RUQ  Result Date: 08/17/2019 CLINICAL DATA:  49year old female with right upper quadrant abdominal pain. EXAM: ULTRASOUND ABDOMEN LIMITED RIGHT UPPER QUADRANT COMPARISON:  None. FINDINGS: Gallbladder: No gallstones or wall thickening visualized. No sonographic Murphy sign noted by sonographer. Common bile duct: Diameter: 5 mm Liver: There is mild diffuse increased liver echogenicity most commonly seen in the setting of fatty infiltration. Superimposed inflammation or fibrosis is not excluded. Clinical correlation is recommended. Portal vein is patent on color Doppler imaging with normal direction of blood flow towards the liver. Other: None. IMPRESSION: Mild fatty liver, otherwise unremarkable right upper quadrant ultrasound. Electronically Signed   By: AAnner CreteM.D.   On: 08/17/2019 23:08    Procedures Procedures (including critical care time)  Medications Ordered in ED Medications  sodium chloride flush (NS) 0.9 % injection 3 mL (has no administration in time range)  HYDROmorphone (DILAUDID) injection 1 mg (has no administration in time range)  famotidine (PEPCID) IVPB 20 mg premix (has no administration in time range)  pantoprazole (PROTONIX) injection 40 mg (has no administration in time range)  sodium chloride 0.9 % bolus 1,000 mL (0 mLs Intravenous Stopped 08/17/19 2202)  HYDROmorphone (DILAUDID) injection 1 mg (1 mg Intravenous Given 08/17/19 2102)  ondansetron (ZOFRAN) injection 4 mg (4 mg Intravenous Given 08/17/19 2101)  iohexol (OMNIPAQUE) 300 MG/ML solution 100 mL (100 mLs Intravenous Contrast Given 08/17/19 2341)    ED Course  I have reviewed the triage vital signs and the nursing notes.  Pertinent labs & imaging results that were available during my care of the patient were reviewed by me and considered in my medical decision making (see chart for details).  49year old female presents with acute  severe constant RUQ abdominal pain that started early this morning. BP is elevated but otherwise vitals are reassuring. On exam she is in mild distress due to pain. She is significantly tender in the RUQ. Labs obtained in triage show leukocytosis (14) which is chronic, hyperglycemia (276), and elevated lipase (208). LFTs are normal. UA shows moderate hgb but otherwise is normal. DDx: cholecystitis, choledocholithiasis, pancreatitis, gastritis/PUD, kidney stone. Will order RUQ [place]and provide pain control  RUQ [place]  is negative. Pt is still having significant pain although it was improved with pain meds. Will order CT abdomen/pelvis and another round of pain meds.  CT shows pancreatitis vs duodenitis. With elevated lipase it likely is pancreatitis. Re-evaluated pt she is still having significant pain. Will request admission. Discussed with Dr. Dina Rich.    Discussed with Dr. Olevia Bowens who will admit.  MDM Rules/Calculators/A&P                       Final Clinical Impression(s) / ED Diagnoses Final diagnoses:  RUQ pain  Acute pancreatitis without infection or necrosis, unspecified pancreatitis type    Rx / DC Orders ED Discharge Orders    None       Recardo Evangelist, PA-C 08/18/19 1516    Merryl Hacker, MD 08/22/19 2340

## 2019-08-17 NOTE — ED Triage Notes (Signed)
Pt states she woke up this am with RUQ pain that has been coming and going and varies in intensity. Pt states eating makes her feel nausea. Hx of diverticulitis "but that usually affected ly left lower side". Denies any V/D.

## 2019-08-18 ENCOUNTER — Other Ambulatory Visit: Payer: Self-pay

## 2019-08-18 ENCOUNTER — Encounter (HOSPITAL_COMMUNITY): Payer: Self-pay | Admitting: Family Medicine

## 2019-08-18 DIAGNOSIS — R519 Headache, unspecified: Secondary | ICD-10-CM | POA: Diagnosis present

## 2019-08-18 DIAGNOSIS — K760 Fatty (change of) liver, not elsewhere classified: Secondary | ICD-10-CM | POA: Diagnosis present

## 2019-08-18 DIAGNOSIS — K5792 Diverticulitis of intestine, part unspecified, without perforation or abscess without bleeding: Secondary | ICD-10-CM | POA: Diagnosis present

## 2019-08-18 DIAGNOSIS — Z794 Long term (current) use of insulin: Secondary | ICD-10-CM | POA: Diagnosis not present

## 2019-08-18 DIAGNOSIS — I10 Essential (primary) hypertension: Secondary | ICD-10-CM | POA: Diagnosis present

## 2019-08-18 DIAGNOSIS — K581 Irritable bowel syndrome with constipation: Secondary | ICD-10-CM | POA: Diagnosis present

## 2019-08-18 DIAGNOSIS — Z6838 Body mass index (BMI) 38.0-38.9, adult: Secondary | ICD-10-CM | POA: Diagnosis not present

## 2019-08-18 DIAGNOSIS — Z20822 Contact with and (suspected) exposure to covid-19: Secondary | ICD-10-CM | POA: Diagnosis present

## 2019-08-18 DIAGNOSIS — E669 Obesity, unspecified: Secondary | ICD-10-CM | POA: Diagnosis present

## 2019-08-18 DIAGNOSIS — Z888 Allergy status to other drugs, medicaments and biological substances status: Secondary | ICD-10-CM | POA: Diagnosis not present

## 2019-08-18 DIAGNOSIS — Z833 Family history of diabetes mellitus: Secondary | ICD-10-CM | POA: Diagnosis not present

## 2019-08-18 DIAGNOSIS — E119 Type 2 diabetes mellitus without complications: Secondary | ICD-10-CM

## 2019-08-18 DIAGNOSIS — Z87891 Personal history of nicotine dependence: Secondary | ICD-10-CM | POA: Diagnosis not present

## 2019-08-18 DIAGNOSIS — K859 Acute pancreatitis without necrosis or infection, unspecified: Secondary | ICD-10-CM | POA: Diagnosis present

## 2019-08-18 DIAGNOSIS — Z8249 Family history of ischemic heart disease and other diseases of the circulatory system: Secondary | ICD-10-CM | POA: Diagnosis not present

## 2019-08-18 DIAGNOSIS — Z887 Allergy status to serum and vaccine status: Secondary | ICD-10-CM | POA: Diagnosis not present

## 2019-08-18 DIAGNOSIS — Z800 Family history of malignant neoplasm of digestive organs: Secondary | ICD-10-CM | POA: Diagnosis not present

## 2019-08-18 DIAGNOSIS — Z825 Family history of asthma and other chronic lower respiratory diseases: Secondary | ICD-10-CM | POA: Diagnosis not present

## 2019-08-18 DIAGNOSIS — Z79899 Other long term (current) drug therapy: Secondary | ICD-10-CM | POA: Diagnosis not present

## 2019-08-18 LAB — TRIGLYCERIDES: Triglycerides: 161 mg/dL — ABNORMAL HIGH (ref <150)

## 2019-08-18 LAB — COMPREHENSIVE METABOLIC PANEL
ALT: 16 U/L (ref 0–44)
AST: 14 U/L — ABNORMAL LOW (ref 15–41)
Albumin: 3.5 g/dL (ref 3.5–5.0)
Alkaline Phosphatase: 95 U/L (ref 38–126)
Anion gap: 8 (ref 5–15)
BUN: 5 mg/dL — ABNORMAL LOW (ref 6–20)
CO2: 22 mmol/L (ref 22–32)
Calcium: 8.9 mg/dL (ref 8.9–10.3)
Chloride: 109 mmol/L (ref 98–111)
Creatinine, Ser: 0.8 mg/dL (ref 0.44–1.00)
GFR calc Af Amer: >60 mL/min (ref >60)
GFR calc non Af Amer: >60 mL/min (ref >60)
Glucose, Bld: 141 mg/dL — ABNORMAL HIGH (ref 70–99)
Potassium: 4.3 mmol/L (ref 3.5–5.1)
Sodium: 139 mmol/L (ref 135–145)
Total Bilirubin: 0.6 mg/dL (ref 0.3–1.2)
Total Protein: 6.1 g/dL — ABNORMAL LOW (ref 6.5–8.1)

## 2019-08-18 LAB — CBC
HCT: 40.9 % (ref 36.0–46.0)
Hemoglobin: 13.8 g/dL (ref 12.0–15.0)
MCH: 31.2 pg (ref 26.0–34.0)
MCHC: 33.7 g/dL (ref 30.0–36.0)
MCV: 92.5 fL (ref 80.0–100.0)
Platelets: 308 10*3/uL (ref 150–400)
RBC: 4.42 MIL/uL (ref 3.87–5.11)
RDW: 13.2 % (ref 11.5–15.5)
WBC: 11.4 10*3/uL — ABNORMAL HIGH (ref 4.0–10.5)
nRBC: 0 % (ref 0.0–0.2)

## 2019-08-18 LAB — HEMOGLOBIN A1C
Hgb A1c MFr Bld: 7.6 % — ABNORMAL HIGH (ref 4.8–5.6)
Mean Plasma Glucose: 171.42 mg/dL

## 2019-08-18 LAB — GLUCOSE, CAPILLARY
Glucose-Capillary: 132 mg/dL — ABNORMAL HIGH (ref 70–99)
Glucose-Capillary: 140 mg/dL — ABNORMAL HIGH (ref 70–99)
Glucose-Capillary: 126 mg/dL — ABNORMAL HIGH (ref 70–99)
Glucose-Capillary: 108 mg/dL — ABNORMAL HIGH (ref 70–99)
Glucose-Capillary: 185 mg/dL — ABNORMAL HIGH (ref 70–99)

## 2019-08-18 LAB — MAGNESIUM: Magnesium: 2.2 mg/dL (ref 1.7–2.4)

## 2019-08-18 LAB — RESPIRATORY PANEL BY RT PCR (FLU A&B, COVID)
Influenza A by PCR: NEGATIVE
Influenza B by PCR: NEGATIVE
SARS Coronavirus 2 by RT PCR: NEGATIVE

## 2019-08-18 LAB — LIPASE, BLOOD: Lipase: 80 U/L — ABNORMAL HIGH (ref 11–51)

## 2019-08-18 LAB — HIV ANTIBODY (ROUTINE TESTING W REFLEX): HIV Screen 4th Generation wRfx: NONREACTIVE

## 2019-08-18 MED ORDER — HYDROMORPHONE HCL 1 MG/ML IJ SOLN: 1.0000 mg | INTRAMUSCULAR | Status: DC | PRN

## 2019-08-18 MED ORDER — PROCHLORPERAZINE EDISYLATE 10 MG/2ML IJ SOLN: 10.0000 mg | Freq: Four times a day (QID) | INTRAMUSCULAR | Status: DC | PRN

## 2019-08-18 MED ORDER — ENOXAPARIN SODIUM 40 MG/0.4ML ~~LOC~~ SOLN
40.0000 mg | SUBCUTANEOUS | Status: DC
  Administered 2019-08-18 – 2019-08-19 (×2): 40 mg via SUBCUTANEOUS
  Filled 2019-08-18 (×2): qty 0.4

## 2019-08-18 MED ORDER — INSULIN GLARGINE 100 UNIT/ML ~~LOC~~ SOLN
10.0000 [IU] | Freq: Every day | SUBCUTANEOUS | Status: DC
  Administered 2019-08-19: 21:00:00 10 [IU] via SUBCUTANEOUS
  Filled 2019-08-18 (×4): qty 0.1

## 2019-08-18 MED ORDER — SENNOSIDES-DOCUSATE SODIUM 8.6-50 MG PO TABS: 1.0000 | ORAL_TABLET | Freq: Every evening | ORAL | Status: DC | PRN

## 2019-08-18 MED ORDER — FAMOTIDINE IN NACL 20-0.9 MG/50ML-% IV SOLN
20.0000 mg | Freq: Once | INTRAVENOUS | Status: AC
  Administered 2019-08-18: 20 mg via INTRAVENOUS
  Filled 2019-08-18: qty 50

## 2019-08-18 MED ORDER — ONDANSETRON HCL 4 MG/2ML IJ SOLN: 4.0000 mg | Freq: Four times a day (QID) | INTRAMUSCULAR | Status: DC | PRN

## 2019-08-18 MED ORDER — HYDROMORPHONE HCL 1 MG/ML IJ SOLN
0.5000 mg | INTRAMUSCULAR | Status: DC | PRN
  Filled 2019-08-18: qty 1

## 2019-08-18 MED ORDER — PANTOPRAZOLE SODIUM 40 MG IV SOLR
40.0000 mg | Freq: Once | INTRAVENOUS | Status: AC
  Administered 2019-08-18: 40 mg via INTRAVENOUS
  Filled 2019-08-18: qty 40

## 2019-08-18 MED ORDER — ACETAMINOPHEN 325 MG PO TABS
650.0000 mg | ORAL_TABLET | Freq: Four times a day (QID) | ORAL | Status: DC | PRN
  Administered 2019-08-19: 22:00:00 650 mg via ORAL
  Filled 2019-08-18: qty 2

## 2019-08-18 MED ORDER — ACETAMINOPHEN 650 MG RE SUPP: 650.0000 mg | Freq: Four times a day (QID) | RECTAL | Status: DC | PRN

## 2019-08-18 MED ORDER — ONDANSETRON HCL 4 MG PO TABS: 4.0000 mg | ORAL_TABLET | Freq: Four times a day (QID) | ORAL | Status: DC | PRN

## 2019-08-18 MED ORDER — INSULIN ASPART 100 UNIT/ML ~~LOC~~ SOLN
0.0000 [IU] | SUBCUTANEOUS | Status: DC
  Administered 2019-08-18 (×3): 1 [IU] via SUBCUTANEOUS
  Administered 2019-08-18: 2 [IU] via SUBCUTANEOUS
  Administered 2019-08-19: 20:00:00 3 [IU] via SUBCUTANEOUS
  Administered 2019-08-20 (×2): 1 [IU] via SUBCUTANEOUS

## 2019-08-18 MED ORDER — SODIUM CHLORIDE 0.9 % IV SOLN: INTRAVENOUS | Status: AC

## 2019-08-18 MED ORDER — MAGNESIUM SULFATE 2 GM/50ML IV SOLN
2.0000 g | Freq: Once | INTRAVENOUS | Status: AC
  Administered 2019-08-18: 01:00:00 2 g via INTRAVENOUS
  Filled 2019-08-18: qty 50

## 2019-08-18 MED ORDER — LABETALOL HCL 5 MG/ML IV SOLN
10.0000 mg | INTRAVENOUS | Status: DC | PRN
  Administered 2019-08-18: 22:00:00 10 mg via INTRAVENOUS
  Filled 2019-08-18 (×2): qty 4

## 2019-08-18 MED ORDER — FAMOTIDINE IN NACL 20-0.9 MG/50ML-% IV SOLN
20.0000 mg | Freq: Two times a day (BID) | INTRAVENOUS | Status: DC
  Administered 2019-08-18 (×2): 20 mg via INTRAVENOUS
  Filled 2019-08-18 (×2): qty 50

## 2019-08-18 MED ORDER — SODIUM CHLORIDE 0.9 % IV BOLUS
1000.0000 mL | Freq: Once | INTRAVENOUS | Status: AC
  Administered 2019-08-18: 01:00:00 1000 mL via INTRAVENOUS

## 2019-08-18 NOTE — Plan of Care (Signed)
  Problem: Education: Goal: Knowledge of Pancreatitis treatment and prevention will improve Outcome: Progressing   

## 2019-08-18 NOTE — H&P (Signed)
History and Physical    Lisa [name] AVW:[id number] DOB: July 06, 1970 DOA: 08/17/2019  PCP: Katheran James., MD   Patient coming from: Home   Chief Complaint: Upper abdominal pain, nausea   HPI: Lisa [name] is a 49 y.o. female with medical history significant for hypertension and insulin-dependent diabetes mellitus, presenting to emergency department with abdominal pain and nausea.  Patient reports that she woke from sleep yesterday with severe pain in her upper abdomen.  Pain has been waxing and waning in intensity, severe at times, localized to the upper abdomen, just right of center, and associated with nausea without vomiting or diarrhea.  She has never experienced this previously, denies any alcohol use, denies personal or family history of pancreatitis, and denies any fevers or chills.  She has used NSAIDs as needed in the past, but none recently.  ED Course: Upon arrival to the ED, patient is found to be afebrile, saturating well on room air, and with stable blood pressure. Serum glucose is 276, lipase 208, and WBC 14,700. CT abdomen and pelvis features edema about the pancreatitis and duodenum suggestive of pancreatitis or duodenitis. RUQ [place] with mild fatty liver and otherwise unremarkable. She was treated with Pepcid, Protonix, multiple doses IV Dilaudid and antiemetics, and 2 liters NS.   Review of Systems:  All other systems reviewed and apart from HPI, are negative.  Past Medical History:  Diagnosis Date  . Diabetes mellitus without complication ([place])   . Diverticulitis   . Hypertension   . IBS (irritable bowel syndrome)     Past Surgical History:  Procedure Laterality Date  . ABDOMINAL HYSTERECTOMY       reports that she has been smoking cigarettes. She has a 20.00 pack-year smoking history. She has never used smokeless tobacco. She reports that she does not drink alcohol or use drugs.  Allergies  Allergen Reactions  . Flu Virus Vaccine Shortness Of Breath and  Other (See Comments)    Was hospitalized because of the reaction  . Metformin And Related Diarrhea and Nausea And Vomiting  . Zolpidem Anxiety and Other (See Comments)    Caused hallucinations, also    Family History  Problem Relation Age of Onset  . COPD Mother   . Congestive Heart Failure Father   . Diabetes Brother   . Irritable bowel syndrome Other        Constipation   . Colon cancer Paternal Grandmother 2     Prior to Admission medications   Medication Sig Start Date End Date Taking? Authorizing Provider  ascorbic acid (VITAMIN C) 500 MG tablet Take 500-1,000 mg by mouth daily.   Yes [provider]  Aspirin-Acetaminophen-Caffeine (GOODY HEADACHE PO) Take 1 packet by mouth as needed (for headaches or pain).   Yes [provider]  ibuprofen (ADVIL,MOTRIN) 200 MG tablet Take 200-400 mg by mouth every 6 (six) hours as needed for headache or mild pain.    Yes [provider]  losartan (COZAAR) 100 MG tablet Take 1 tablet (100 mg total) by mouth daily. 02/27/18  Yes Carmin Muskrat, MD  OZEMPIC, 0.25 OR 0.5 MG/DOSE, 2 MG/1.5ML SOPN Inject 0.5 mg into the skin every Saturday.  12/11/17  Yes [provider]  blood glucose meter kit and supplies KIT Dispense based on patient and insurance preference. Use up to four times daily as directed. (FOR ICD-9 250.00, 250.01). 12/04/15   Waynetta Pean, PA-C  glyBURIDE (DIABETA) 2.5 MG tablet Take 1 tablet (2.5 mg total) by mouth daily  with breakfast. For one week. Then 2 tablets (5 mg total) by mouth daily with breakfast. Patient not taking: Reported on 08/17/2019 12/04/15   Waynetta Pean, PA-C  insulin glargine, 1 Unit Dial, (TOUJEO) 300 UNIT/ML Solostar Pen Inject 30 Units into the skin See admin instructions. Inject up to 30 units into the skin once a day 12/01/18   [provider]    Physical Exam: Vitals:   08/17/19 1954 08/17/19 2145  BP: (!) 158/97 (!) 148/106  Pulse: 94 83  Resp: 20 (!) 22    Temp: 99 F (37.2 C)   TempSrc: Oral   SpO2: 100% 96%    Constitutional: NAD, calm  Eyes: PERTLA, lids and conjunctivae normal ENMT: Mucous membranes are moist. Posterior pharynx clear of any exudate or lesions.   Neck: normal, supple, no masses, no thyromegaly Respiratory: no wheezing, no crackles. No accessory muscle use.  Cardiovascular: S1 & S2 heard, regular rate and rhythm. No extremity edema.  Abdomen: soft, tender in upper abdomen without rebound pain or guarding. Bowel sounds active.  Musculoskeletal: no clubbing / cyanosis. No joint deformity upper and lower extremities.   Skin: no significant rashes, lesions, ulcers. Warm, dry, well-perfused. Neurologic: No facial asymmetry. Sensation intact. Moving all extremities.  Psychiatric: Alert and oriented to person, place, and situation. Pleasant and cooperative.    Labs and Imaging on Admission: I have personally reviewed following labs and imaging studies  CBC: Recent Labs  Lab 08/17/19 1806  WBC 14.7*  HGB 14.8  HCT 42.7  MCV 92.0  PLT 568   Basic Metabolic Panel: Recent Labs  Lab 08/17/19 1806  NA 134*  K 3.9  CL 105  CO2 21*  GLUCOSE 276*  BUN 8  CREATININE 0.79  CALCIUM 9.0   GFR: CrCl cannot be calculated (Unknown ideal weight.). Liver Function Tests: Recent Labs  Lab 08/17/19 1806  AST 15  ALT 18  ALKPHOS 105  BILITOT 0.7  PROT 6.5  ALBUMIN 3.9   Recent Labs  Lab 08/17/19 1806  LIPASE 208*   No results for input(s): AMMONIA in the last 168 hours. Coagulation Profile: No results for input(s): INR, PROTIME in the last 168 hours. Cardiac Enzymes: No results for input(s): CKTOTAL, CKMB, CKMBINDEX, TROPONINI in the last 168 hours. BNP (last 3 results) No results for input(s): PROBNP in the last 8760 hours. HbA1C: No results for input(s): HGBA1C in the last 72 hours. CBG: No results for input(s): GLUCAP in the last 168 hours. Lipid Profile: No results for input(s): CHOL, HDL, LDLCALC,  TRIG, CHOLHDL, LDLDIRECT in the last 72 hours. Thyroid Function Tests: No results for input(s): TSH, T4TOTAL, FREET4, T3FREE, THYROIDAB in the last 72 hours. Anemia Panel: No results for input(s): VITAMINB12, FOLATE, FERRITIN, TIBC, IRON, RETICCTPCT in the last 72 hours. Urine analysis:    Component Value Date/Time   COLORURINE YELLOW 08/17/2019 1955   APPEARANCEUR CLEAR 08/17/2019 1955   LABSPEC 1.008 08/17/2019 1955   PHURINE 5.0 08/17/2019 1955   GLUCOSEU NEGATIVE 08/17/2019 1955   HGBUR MODERATE (A) 08/17/2019 1955   BILIRUBINUR NEGATIVE 08/17/2019 [place] NEGATIVE 08/17/2019 1955   PROTEINUR NEGATIVE 08/17/2019 1955   NITRITE NEGATIVE 08/17/2019 1955   LEUKOCYTESUR NEGATIVE 08/17/2019 1955   Sepsis Labs: '@LABRCNTIP' (procalcitonin:4,lacticidven:4) )No results found for this or any previous visit (from the past 240 hour(s)).   Radiological Exams on Admission: CT Abdomen Pelvis W Contrast  Result Date: 08/17/2019 CLINICAL DATA:  Right upper quadrant pain EXAM: CT ABDOMEN AND PELVIS WITH  CONTRAST TECHNIQUE: Multidetector CT imaging of the abdomen and pelvis was performed using the standard protocol following bolus administration of intravenous contrast. CONTRAST:  15m OMNIPAQUE IOHEXOL 300 MG/ML  SOLN COMPARISON:  Ultrasound 08/17/2019, CT 04/14/2015 FINDINGS: Lower chest: No acute abnormality. Hepatobiliary: Hepatic steatosis. No calcified gallstone or biliary dilatation. Pancreas: No ductal enlargement. Edema and fluid at the pancreaticoduodenal groove. Spleen: Normal in size without focal abnormality. Adrenals/Urinary Tract: Adrenal glands are unremarkable. Kidneys are normal, without renal calculi, focal lesion, or hydronephrosis. Bladder is unremarkable. Stomach/Bowel: Stomach is nonenlarged. Fluid and edema surrounding the second and third portion of duodenum. No extraluminal gas collection. Vascular/Lymphatic: No significant vascular findings are present. No enlarged  abdominal or pelvic lymph nodes. Reproductive: Status post hysterectomy. No adnexal masses. Other: No free air Musculoskeletal: No acute or significant osseous findings. IMPRESSION: 1. Edema and fluid at the pancreaticoduodenal groove and surrounding the second and third portion of duodenum. Findings could be secondary to pancreatitis versus duodenitis. Recommend correlation with appropriate enzymes. 2. Hepatic steatosis Electronically Signed   By: KDonavan FoilM.D.   On: 08/17/2019 23:52   UKoreaAbdomen Limited RUQ  Result Date: 08/17/2019 CLINICAL DATA:  49year old female with right upper quadrant abdominal pain. EXAM: ULTRASOUND ABDOMEN LIMITED RIGHT UPPER QUADRANT COMPARISON:  None. FINDINGS: Gallbladder: No gallstones or wall thickening visualized. No sonographic Murphy sign noted by sonographer. Common bile duct: Diameter: 5 mm Liver: There is mild diffuse increased liver echogenicity most commonly seen in the setting of fatty infiltration. Superimposed inflammation or fibrosis is not excluded. Clinical correlation is recommended. Portal vein is patent on color Doppler imaging with normal direction of blood flow towards the liver. Other: None. IMPRESSION: Mild fatty liver, otherwise unremarkable right upper quadrant ultrasound. Electronically Signed   By: AAnner CreteM.D.   On: 08/17/2019 23:08    Assessment/Plan   1. Acute pancreatitis  - Presents with 1 day of upper abdominal pain and nausea, found to have lipase 4x ULN and CT findings compatible with pancreatitis  - She denies alcohol use, does not have cholelithiasis on [place]or elevated LFTs, and denies hx of pancreatitis  - She was given several doses of IV analgesic and was fluid-resuscitated in ED with 2 liters NS  - Continue bowel-rest, IVF hydration, pain-control, check triglycerides, monitor for complications   2. Insulin-dependent DM  - No recent A1c on file  - Check CBGs and use Lantus and correctional Novolog for now    3.  Hypertension  - BP elevated in ED, pain likely contributing  - Continue pain-control, as-needed labetalol    DVT prophylaxis: Lovenox  Code Status: Full  Family Communication: Discussed with patient  Disposition Plan:  Patient is from: Home  Anticipated d/c is to: Home  Anticipated d/c date is: 08/20/19 Patient currently: Requiring IV analgesics and IV fluids for acute pancreatitis  Consults called: None  Admission status: Inpatient     TVianne Bulls MD [facility] Pager: See [email or web address]  If 7AM-7PM, please contact the daytime attending [email or web address]  08/18/2019, 1:39 AM

## 2019-08-18 NOTE — Progress Notes (Signed)
PT admitted earlier this am by Dr Myna Hidalgo, please his note for detailed H&P.   49 year old lady with prior h/o hypertension and IDDM, presents to ED with abd pain and nausea. She was found to have acute pancreatitis. CT abd and pelvis showed features Edema and fluid at the pancreaticoduodenal groove and surrounding the second and third portion of duodenum. Findings could be secondary to pancreatitis versus duodenitis.  She was seen and examined at bedside.   Alert and slightly uncomfortable , has persistent abd pain .  Lungs clear to auscultation, no wheezing or rhonchi CVS s1s2 RRR,  abd is soft, mild gen tenderness, bs+ Extremities no pedal edema.    Plan:  Continue with IV fluids, NPO, bowel rest, pain control.   Hosie Poisson, MD   Addendum:   RN reports pt did not have any pain, she will be started on clear liquid diet.   Hosie Poisson, MD

## 2019-08-19 LAB — GLUCOSE, CAPILLARY
Glucose-Capillary: 100 mg/dL — ABNORMAL HIGH (ref 70–99)
Glucose-Capillary: 103 mg/dL — ABNORMAL HIGH (ref 70–99)
Glucose-Capillary: 127 mg/dL — ABNORMAL HIGH (ref 70–99)
Glucose-Capillary: 128 mg/dL — ABNORMAL HIGH (ref 70–99)
Glucose-Capillary: 139 mg/dL — ABNORMAL HIGH (ref 70–99)
Glucose-Capillary: 227 mg/dL — ABNORMAL HIGH (ref 70–99)

## 2019-08-19 LAB — COMPREHENSIVE METABOLIC PANEL
ALT: 17 U/L (ref 0–44)
AST: 14 U/L — ABNORMAL LOW (ref 15–41)
Albumin: 3.6 g/dL (ref 3.5–5.0)
Alkaline Phosphatase: 98 U/L (ref 38–126)
Anion gap: 9 (ref 5–15)
BUN: 5 mg/dL — ABNORMAL LOW (ref 6–20)
CO2: 24 mmol/L (ref 22–32)
Calcium: 9.2 mg/dL (ref 8.9–10.3)
Chloride: 108 mmol/L (ref 98–111)
Creatinine, Ser: 0.73 mg/dL (ref 0.44–1.00)
GFR calc Af Amer: >60 mL/min (ref >60)
GFR calc non Af Amer: >60 mL/min (ref >60)
Glucose, Bld: 126 mg/dL — ABNORMAL HIGH (ref 70–99)
Potassium: 3.8 mmol/L (ref 3.5–5.1)
Sodium: 141 mmol/L (ref 135–145)
Total Bilirubin: 0.7 mg/dL (ref 0.3–1.2)
Total Protein: 6.3 g/dL — ABNORMAL LOW (ref 6.5–8.1)

## 2019-08-19 LAB — CBC
HCT: 40.6 % (ref 36.0–46.0)
Hemoglobin: 13.8 g/dL (ref 12.0–15.0)
MCH: 31.1 pg (ref 26.0–34.0)
MCHC: 34 g/dL (ref 30.0–36.0)
MCV: 91.4 fL (ref 80.0–100.0)
Platelets: 311 10*3/uL (ref 150–400)
RBC: 4.44 MIL/uL (ref 3.87–5.11)
RDW: 13.2 % (ref 11.5–15.5)
WBC: 10.2 10*3/uL (ref 4.0–10.5)
nRBC: 0 % (ref 0.0–0.2)

## 2019-08-19 LAB — LIPASE, BLOOD: Lipase: 47 U/L (ref 11–51)

## 2019-08-19 MED ORDER — FAMOTIDINE 20 MG PO TABS
20.0000 mg | ORAL_TABLET | Freq: Two times a day (BID) | ORAL | Status: DC
  Administered 2019-08-19 – 2019-08-20 (×3): 20 mg via ORAL
  Filled 2019-08-19 (×3): qty 1

## 2019-08-19 MED ORDER — LOSARTAN POTASSIUM 50 MG PO TABS
100.0000 mg | ORAL_TABLET | Freq: Every day | ORAL | Status: DC
  Administered 2019-08-19 – 2019-08-20 (×2): 100 mg via ORAL
  Filled 2019-08-19 (×2): qty 2

## 2019-08-19 NOTE — Progress Notes (Signed)
PROGRESS NOTE    Lisa [name]  [id number] DOB: 07-27-1970 DOA: 08/17/2019 PCP: Katheran James., MD    Chief Complaint  Patient presents with  . Abdominal Pain    Brief Narrative:  49 year old lady with prior h/o hypertension and IDDM, presents to ED with abd pain and nausea. She was found to have acute pancreatitis. CT abd and pelvis showed features Edema and fluid at the pancreaticoduodenal groove and surrounding the second and third portion of duodenum. Findings could be secondary to pancreatitis versus duodenitis.   Assessment & Plan:   Principal Problem:   Acute pancreatitis Active Problems:   Diabetes mellitus without complication ([place])   Hypertension   Acute Pancreatitis vs Duodenitis:  Improving abd pain and nausea, but not resolved. No vomiting.  Lipase levels wnl.  Hydrated appropriately.  He was started on clear liquid diet , advanced to soft today, if she is able to tolerate , will plan for discharge in am.    DM:  CBG (last 3)  Recent Labs    08/19/19 0803 08/19/19 1220 08/19/19 1611  GLUCAP 139* 128* 100*   Resume SSI and lantus.  A1c is 7.6    Essential hypertension;  Suboptimally controlled, probably sec to abd pain and not on her home meds.  Restarted her home meds.    DVT prophylaxis: Lovenox. Code Status:  Full code.  Family Communication: none at bedside.  Disposition:   Status is: Inpatient  Remains inpatient appropriate because:Inpatient level of care appropriate due to severity of illness   Dispo: The patient is from: Home              Anticipated d/c is to: Home              Anticipated d/c date is: 1 day              Patient currently is not medically stable to d/c.        Consultants:   None.    Procedures: none.    Antimicrobials: none.    Subjective: No chest pain or sob, abd pain improving. Some nausea is present, no vomiting.   Objective: Vitals:   08/18/19 1006 08/18/19 1618 08/18/19 2131  08/19/19 0402  BP: (!) 171/97 (!) 153/98 (!) 180/111 (!) 153/99  Pulse: 76 71 79 86  Resp: 18 18 17 17   Temp: 97.7 F (36.5 C) 97.9 F (36.6 C) 97.6 F (36.4 C) 98 F (36.7 C)  TempSrc: Oral Oral Oral Oral  SpO2: 99% 99% 93% 95%  Weight:      Height:        Intake/Output Summary (Last 24 hours) at 08/19/2019 1620 Last data filed at 08/19/2019 1315 Gross per 24 hour  Intake 3075.64 ml  Output --  Net 3075.64 ml   Filed Weights   08/18/19 0218  Weight: 114.8 kg    Examination:  General exam: Appears calm and comfortable  Respiratory system: Clear to auscultation. Respiratory effort normal. Cardiovascular system: S1 & S2 heard, RRR. No JVD, No pedal edema. Gastrointestinal system: Abdomen is nondistended, soft , mildly tender, bs+ Central nervous system: alert and oriented, non focal.  Extremities: no pedal edema.  Skin: no rashes.  Psychiatry:  Mood & affect appropriate.     Data Reviewed: I have personally reviewed following labs and imaging studies  CBC: Recent Labs  Lab 08/17/19 1806 08/18/19 0639 08/19/19 0238  WBC 14.7* 11.4* 10.2  HGB 14.8 13.8 13.8  HCT 42.7 40.9  40.6  MCV 92.0 92.5 91.4  PLT 349 308 [id number]    Basic Metabolic Panel: Recent Labs  Lab 08/17/19 1806 08/18/19 0639 08/19/19 0238  NA 134* 139 141  K 3.9 4.3 3.8  CL 105 109 108  CO2 21* 22 24  GLUCOSE 276* 141* 126*  BUN 8 5* 5*  CREATININE 0.79 0.80 0.73  CALCIUM 9.0 8.9 9.2  MG  --  2.2  --     GFR: Estimated Creatinine Clearance: 114.4 mL/min (by C-G formula based on SCr of 0.73 mg/dL).  Liver Function Tests: Recent Labs  Lab 08/17/19 1806 08/18/19 0639 08/19/19 0238  AST 15 14* 14*  ALT 18 16 17   ALKPHOS 105 95 98  BILITOT 0.7 0.6 0.7  PROT 6.5 6.1* 6.3*  ALBUMIN 3.9 3.5 3.6    CBG: Recent Labs  Lab 08/19/19 0011 08/19/19 0400 08/19/19 0803 08/19/19 1220 08/19/19 1611  GLUCAP 103* 127* 139* 128* 100*     Recent Results (from the past 240 hour(s))   Respiratory Panel by RT PCR (Flu A&B, Covid) - Nasopharyngeal Swab     Status: None   Collection Time: 08/18/19 12:45 AM   Specimen: Nasopharyngeal Swab  Result Value Ref Range Status   SARS Coronavirus 2 by RT PCR NEGATIVE NEGATIVE Final    Comment: (NOTE) SARS-CoV-2 target nucleic acids are NOT DETECTED. The SARS-CoV-2 RNA is generally detectable in upper respiratoy specimens during the acute phase of infection. The lowest concentration of SARS-CoV-2 viral copies this assay can detect is 131 copies/mL. A negative result does not preclude SARS-Cov-2 infection and should not be used as the sole basis for treatment or other patient management decisions. A negative result may occur with  improper specimen collection/handling, submission of specimen other than nasopharyngeal swab, presence of viral mutation(s) within the areas targeted by this assay, and inadequate number of viral copies (<131 copies/mL). A negative result must be combined with clinical observations, patient history, and epidemiological information. The expected result is Negative. Fact Sheet for Patients:  PinkCheek.be Fact Sheet for Healthcare Providers:  GravelBags.it This test is not yet ap proved or cleared by the [place] FDA and  has been authorized for detection and/or diagnosis of SARS-CoV-2 by FDA under an Emergency Use Authorization (EUA). This EUA will remain  in effect (meaning this test can be used) for the duration of the COVID-19 declaration under Section 564(b)(1) of the Act, 21 U.S.C. section 360bbb-3(b)(1), unless the authorization is terminated or revoked sooner.    Influenza A by PCR NEGATIVE NEGATIVE Final   Influenza B by PCR NEGATIVE NEGATIVE Final    Comment: (NOTE) The Xpert Xpress SARS-CoV-2/FLU/RSV assay is intended as an aid in  the diagnosis of influenza from Nasopharyngeal swab specimens and  should not be used as a sole  basis for treatment. Nasal washings and  aspirates are unacceptable for Xpert Xpress SARS-CoV-2/FLU/RSV  testing. Fact Sheet for Patients: PinkCheek.be Fact Sheet for Healthcare Providers: GravelBags.it This test is not yet approved or cleared by the [place] FDA and  has been authorized for detection and/or diagnosis of SARS-CoV-2 by  FDA under an Emergency Use Authorization (EUA). This EUA will remain  in effect (meaning this test can be used) for the duration of the  Covid-19 declaration under Section 564(b)(1) of the Act, 21  U.S.C. section 360bbb-3(b)(1), unless the authorization is  terminated or revoked. Performed at [facility], [place] [address], [place], [place] [address]  Radiology Studies: CT Abdomen Pelvis W Contrast  Result Date: 08/17/2019 CLINICAL DATA:  Right upper quadrant pain EXAM: CT ABDOMEN AND PELVIS WITH CONTRAST TECHNIQUE: Multidetector CT imaging of the abdomen and pelvis was performed using the standard protocol following bolus administration of intravenous contrast. CONTRAST:  187mL OMNIPAQUE IOHEXOL 300 MG/ML  SOLN COMPARISON:  Ultrasound 08/17/2019, CT 04/14/2015 FINDINGS: Lower chest: No acute abnormality. Hepatobiliary: Hepatic steatosis. No calcified gallstone or biliary dilatation. Pancreas: No ductal enlargement. Edema and fluid at the pancreaticoduodenal groove. Spleen: Normal in size without focal abnormality. Adrenals/Urinary Tract: Adrenal glands are unremarkable. Kidneys are normal, without renal calculi, focal lesion, or hydronephrosis. Bladder is unremarkable. Stomach/Bowel: Stomach is nonenlarged. Fluid and edema surrounding the second and third portion of duodenum. No extraluminal gas collection. Vascular/Lymphatic: No significant vascular findings are present. No enlarged abdominal or pelvic lymph nodes. Reproductive: Status post hysterectomy. No adnexal masses. Other: No  free air Musculoskeletal: No acute or significant osseous findings. IMPRESSION: 1. Edema and fluid at the pancreaticoduodenal groove and surrounding the second and third portion of duodenum. Findings could be secondary to pancreatitis versus duodenitis. Recommend correlation with appropriate enzymes. 2. Hepatic steatosis Electronically Signed   By: Donavan Foil M.D.   On: 08/17/2019 23:52   US Abdomen Limited RUQ  Result Date: 08/17/2019 CLINICAL DATA:  49 year old female with right upper quadrant abdominal pain. EXAM: ULTRASOUND ABDOMEN LIMITED RIGHT UPPER QUADRANT COMPARISON:  None. FINDINGS: Gallbladder: No gallstones or wall thickening visualized. No sonographic Murphy sign noted by sonographer. Common bile duct: Diameter: 5 mm Liver: There is mild diffuse increased liver echogenicity most commonly seen in the setting of fatty infiltration. Superimposed inflammation or fibrosis is not excluded. Clinical correlation is recommended. Portal vein is patent on color Doppler imaging with normal direction of blood flow towards the liver. Other: None. IMPRESSION: Mild fatty liver, otherwise unremarkable right upper quadrant ultrasound. Electronically Signed   By: Anner Crete M.D.   On: 08/17/2019 23:08        Scheduled Meds: . enoxaparin (LOVENOX) injection  40 mg Subcutaneous Q24H  . famotidine  20 mg Oral BID  . insulin aspart  0-9 Units Subcutaneous Q4H  . insulin glargine  10 Units Subcutaneous QHS   Continuous Infusions:   LOS: 1 day       Hosie Poisson, MD [facility]   To contact the attending provider between 7A-7P or the covering provider during after hours 7P-7A, please log into the web site [email or web address] and access using universal [place] password for that web site. If you do not have the password, please call the hospital operator.  08/19/2019, 4:20 PM

## 2019-08-20 LAB — CBC
HCT: 42.2 % (ref 36.0–46.0)
Hemoglobin: 14.5 g/dL (ref 12.0–15.0)
MCH: 31.5 pg (ref 26.0–34.0)
MCHC: 34.4 g/dL (ref 30.0–36.0)
MCV: 91.5 fL (ref 80.0–100.0)
Platelets: 328 10*3/uL (ref 150–400)
RBC: 4.61 MIL/uL (ref 3.87–5.11)
RDW: 13 % (ref 11.5–15.5)
WBC: 10.6 10*3/uL — ABNORMAL HIGH (ref 4.0–10.5)
nRBC: 0 % (ref 0.0–0.2)

## 2019-08-20 LAB — COMPREHENSIVE METABOLIC PANEL
ALT: 16 U/L (ref 0–44)
AST: 14 U/L — ABNORMAL LOW (ref 15–41)
Albumin: 3.8 g/dL (ref 3.5–5.0)
Alkaline Phosphatase: 93 U/L (ref 38–126)
Anion gap: 10 (ref 5–15)
BUN: 10 mg/dL (ref 6–20)
CO2: 25 mmol/L (ref 22–32)
Calcium: 9.5 mg/dL (ref 8.9–10.3)
Chloride: 107 mmol/L (ref 98–111)
Creatinine, Ser: 0.81 mg/dL (ref 0.44–1.00)
GFR calc Af Amer: >60 mL/min (ref >60)
GFR calc non Af Amer: >60 mL/min (ref >60)
Glucose, Bld: 121 mg/dL — ABNORMAL HIGH (ref 70–99)
Potassium: 3.9 mmol/L (ref 3.5–5.1)
Sodium: 142 mmol/L (ref 135–145)
Total Bilirubin: 0.8 mg/dL (ref 0.3–1.2)
Total Protein: 6.6 g/dL (ref 6.5–8.1)

## 2019-08-20 LAB — GLUCOSE, CAPILLARY
Glucose-Capillary: 107 mg/dL — ABNORMAL HIGH (ref 70–99)
Glucose-Capillary: 108 mg/dL — ABNORMAL HIGH (ref 70–99)
Glucose-Capillary: 132 mg/dL — ABNORMAL HIGH (ref 70–99)
Glucose-Capillary: 134 mg/dL — ABNORMAL HIGH (ref 70–99)

## 2019-08-20 LAB — LIPASE, BLOOD: Lipase: 49 U/L (ref 11–51)

## 2019-08-20 MED ORDER — LOSARTAN POTASSIUM 50 MG PO TABS
100.0000 mg | ORAL_TABLET | Freq: Every day | ORAL | Status: DC
  Administered 2019-08-20: 12:00:00 100 mg via ORAL
  Filled 2019-08-20: qty 2

## 2019-08-20 MED ORDER — FAMOTIDINE 20 MG PO TABS: 20.0000 mg | ORAL_TABLET | Freq: Two times a day (BID) | ORAL | 0 refills | Status: DC

## 2019-08-20 MED ORDER — SENNOSIDES-DOCUSATE SODIUM 8.6-50 MG PO TABS: 1.0000 | ORAL_TABLET | Freq: Every evening | ORAL | Status: DC | PRN

## 2019-08-20 MED ORDER — AMLODIPINE BESYLATE 5 MG PO TABS
5.0000 mg | ORAL_TABLET | Freq: Every day | ORAL | Status: DC
  Administered 2019-08-20: 5 mg via ORAL
  Filled 2019-08-20: qty 1

## 2019-08-20 MED ORDER — AMLODIPINE BESYLATE 5 MG PO TABS: 5.0000 mg | ORAL_TABLET | Freq: Every day | ORAL | 1 refills | Status: DC

## 2019-08-20 MED ORDER — LOSARTAN POTASSIUM 100 MG PO TABS: 100.0000 mg | ORAL_TABLET | Freq: Every day | ORAL | 0 refills | Status: AC

## 2019-08-20 NOTE — Plan of Care (Signed)
  Problem: Education: Goal: Knowledge of Pancreatitis treatment and prevention will improve Outcome: Progressing   Problem: Clinical Measurements: Goal: Complications related to the disease process, condition or treatment will be avoided or minimized Outcome: Progressing

## 2019-08-20 NOTE — Progress Notes (Signed)
AVS given and reviewed with pt. Medications discussed. All questions answered to satisfaction. Pt verbalized understanding of information given. Pt chose to walk off the unit with all belongings.  

## 2019-08-21 NOTE — Discharge Summary (Signed)
Physician Discharge Summary  Lisa [name] ACZ:[id number] DOB: [birth year] DOA: 08/17/2019  PCP: Lisa [name]., MD  Admit date: 08/17/2019 Discharge date: 08/20/2019  Admitted From: Home.  Disposition:  Home.   Recommendations for Outpatient Follow-up:  1. Follow up with PCP in 1-2 weeks 2. Please obtain BMP/CBC in one week 3. Recommend follow up with Gastroenterology in one week.   Discharge Condition: stable.  CODE STATUS: full code.  Diet recommendation: Heart Healthy   Brief/Interim Summary: 49 year old lady with prior h/o hypertension and IDDM, presents to ED with abd pain and nausea. She was found to have acute pancreatitis. CT abd and pelvis showed featuresEdema and fluid at the pancreaticoduodenal groove and surrounding the second and third portion of duodenum. Findings could be secondary to pancreatitis versus duodenitis.  She was started on clears and advanced as tolerated.   Discharge Diagnoses:  Principal Problem:   Acute pancreatitis Active Problems:   Diabetes mellitus without complication ([place])   Hypertension  Acute Pancreatitis vs Duodenitis:  abd pain, nausea and vomiting have resolved.  Lipase levels wnl.  Hydrated appropriately and discharged on oral pepcid.  She  was started on clear liquid diet , advanced to soft today was able to tolerate without any issues. Recommend outpatient follow up with gastroenterology.   DM:  Well controlled cbg's.  Resume home meds on discharge.  A1c is 7.6    Essential hypertension;  Suboptimally controlled,  Restarted her home meds and added norvasc to her regimen.     Discharge Instructions  Discharge Instructions    Diet - low sodium heart healthy   Complete by: As directed    Discharge instructions   Complete by: As directed    *please follow up with PCP in one week.     Allergies as of 08/20/2019      Reactions   Flu Virus Vaccine Shortness Of Breath, Other (See Comments)   Was hospitalized  because of the reaction   Metformin And Related Diarrhea, Nausea And Vomiting   Zolpidem Anxiety, Other (See Comments)   Caused hallucinations, also      Medication List    STOP taking these medications   glyBURIDE 2.5 MG tablet Commonly known as: DIABETA   GOODY HEADACHE PO   ibuprofen 200 MG tablet Commonly known as: ADVIL   insulin glargine (1 Unit Dial) 300 UNIT/ML Solostar Pen Commonly known as: TOUJEO     TAKE these medications   amLODipine 5 MG tablet Commonly known as: NORVASC Take 1 tablet (5 mg total) by mouth daily.   ascorbic acid 500 MG tablet Commonly known as: VITAMIN C Take 500-1,000 mg by mouth daily.   blood glucose meter kit and supplies Kit Dispense based on patient and insurance preference. Use up to four times daily as directed. (FOR ICD-9 250.00, 250.01).   famotidine 20 MG tablet Commonly known as: PEPCID Take 1 tablet (20 mg total) by mouth 2 (two) times daily for 10 days.   losartan 100 MG tablet Commonly known as: COZAAR Take 1 tablet (100 mg total) by mouth daily.   Ozempic (0.25 or 0.5 MG/DOSE) 2 MG/1.5ML Sopn Generic drug: Semaglutide(0.25 or 0.5MG/DOS) Inject 0.5 mg into the skin every Saturday.   senna-docusate 8.6-50 MG tablet Commonly known as: Senokot-S Take 1 tablet by mouth at bedtime as needed for mild constipation.       Allergies  Allergen Reactions  . Flu Virus Vaccine Shortness Of Breath and Other (See Comments)    Was  hospitalized because of the reaction  . Metformin And Related Diarrhea and Nausea And Vomiting  . Zolpidem Anxiety and Other (See Comments)    Caused hallucinations, also    Consultations:  None.    Procedures/Studies: CT Abdomen Pelvis W Contrast  Result Date: 08/17/2019 CLINICAL DATA:  Right upper quadrant pain EXAM: CT ABDOMEN AND PELVIS WITH CONTRAST TECHNIQUE: Multidetector CT imaging of the abdomen and pelvis was performed using the standard protocol following bolus administration of  intravenous contrast. CONTRAST:  118m OMNIPAQUE IOHEXOL 300 MG/ML  SOLN COMPARISON:  Ultrasound 08/17/2019, CT 04/14/2015 FINDINGS: Lower chest: No acute abnormality. Hepatobiliary: Hepatic steatosis. No calcified gallstone or biliary dilatation. Pancreas: No ductal enlargement. Edema and fluid at the pancreaticoduodenal groove. Spleen: Normal in size without focal abnormality. Adrenals/Urinary Tract: Adrenal glands are unremarkable. Kidneys are normal, without renal calculi, focal lesion, or hydronephrosis. Bladder is unremarkable. Stomach/Bowel: Stomach is nonenlarged. Fluid and edema surrounding the second and third portion of duodenum. No extraluminal gas collection. Vascular/Lymphatic: No significant vascular findings are present. No enlarged abdominal or pelvic lymph nodes. Reproductive: Status post hysterectomy. No adnexal masses. Other: No free air Musculoskeletal: No acute or significant osseous findings. IMPRESSION: 1. Edema and fluid at the pancreaticoduodenal groove and surrounding the second and third portion of duodenum. Findings could be secondary to pancreatitis versus duodenitis. Recommend correlation with appropriate enzymes. 2. Hepatic steatosis Electronically Signed   By: KDonavan FoilM.D.   On: 08/17/2019 23:52   [place]domen Limited RUQ  Result Date: 08/17/2019 CLINICAL DATA:  49year old female with right upper quadrant abdominal pain. EXAM: ULTRASOUND ABDOMEN LIMITED RIGHT UPPER QUADRANT COMPARISON:  None. FINDINGS: Gallbladder: No gallstones or wall thickening visualized. No sonographic Murphy sign noted by sonographer. Common bile duct: Diameter: 5 mm Liver: There is mild diffuse increased liver echogenicity most commonly seen in the setting of fatty infiltration. Superimposed inflammation or fibrosis is not excluded. Clinical correlation is recommended. Portal vein is patent on color Doppler imaging with normal direction of blood flow towards the liver. Other: None. IMPRESSION: Mild  fatty liver, otherwise unremarkable right upper quadrant ultrasound. Electronically Signed   By: AAnner CreteM.D.   On: 08/17/2019 23:08       Subjective:  No new complaints.  Discharge Exam: Vitals:   08/20/19 0830 08/20/19 1128  BP: (!) 160/107 (!) 165/117  Pulse:  84  Resp:    Temp:    SpO2:     Vitals:   08/19/19 2250 08/20/19 0423 08/20/19 0830 08/20/19 1128  BP: (!) 177/110 (!) 159/111 (!) 160/107 (!) 165/117  Pulse: 75 77  84  Resp: 18 18    Temp: 98 F (36.7 C) 97.9 F (36.6 C)    TempSrc: Oral Oral    SpO2: 99% 100%    Weight:      Height:        General: Pt is alert, awake, not in acute distress Cardiovascular: RRR, S1/S2 +, no rubs, no gallops Respiratory: CTA bilaterally, no wheezing, no rhonchi Abdominal: Soft, NT, ND, bowel sounds + Extremities: no edema, no cyanosis    The results of significant diagnostics from this hospitalization (including imaging, microbiology, ancillary and laboratory) are listed below for reference.     Microbiology: Recent Results (from the past 240 hour(s))  Respiratory Panel by RT PCR (Flu A&B, Covid) - Nasopharyngeal Swab     Status: None   Collection Time: 08/18/19 12:45 AM   Specimen: Nasopharyngeal Swab  Result Value Ref Range Status   SARS  Coronavirus 2 by RT PCR NEGATIVE NEGATIVE Final    Comment: (NOTE) SARS-CoV-2 target nucleic acids are NOT DETECTED. The SARS-CoV-2 RNA is generally detectable in upper respiratoy specimens during the acute phase of infection. The lowest concentration of SARS-CoV-2 viral copies this assay can detect is 131 copies/mL. A negative result does not preclude SARS-Cov-2 infection and should not be used as the sole basis for treatment or other patient management decisions. A negative result may occur with  improper specimen collection/handling, submission of specimen other than nasopharyngeal swab, presence of viral mutation(s) within the areas targeted by this assay, and  inadequate number of viral copies (<131 copies/mL). A negative result must be combined with clinical observations, patient history, and epidemiological information. The expected result is Negative. Fact Sheet for Patients:  PinkCheek.be Fact Sheet for Healthcare Providers:  GravelBags.it This test is not yet ap proved or cleared by the [place] FDA and  has been authorized for detection and/or diagnosis of SARS-CoV-2 by FDA under an Emergency Use Authorization (EUA). This EUA will remain  in effect (meaning this test can be used) for the duration of the COVID-19 declaration under Section 564(b)(1) of the Act, 21 U.S.C. section 360bbb-3(b)(1), unless the authorization is terminated or revoked sooner.    Influenza A by PCR NEGATIVE NEGATIVE Final   Influenza B by PCR NEGATIVE NEGATIVE Final    Comment: (NOTE) The Xpert Xpress SARS-CoV-2/FLU/RSV assay is intended as an aid in  the diagnosis of influenza from Nasopharyngeal swab specimens and  should not be used as a sole basis for treatment. Nasal washings and  aspirates are unacceptable for Xpert Xpress SARS-CoV-2/FLU/RSV  testing. Fact Sheet for Patients: PinkCheek.be Fact Sheet for Healthcare Providers: GravelBags.it This test is not yet approved or cleared by the [place] FDA and  has been authorized for detection and/or diagnosis of SARS-CoV-2 by  FDA under an Emergency Use Authorization (EUA). This EUA will remain  in effect (meaning this test can be used) for the duration of the  Covid-19 declaration under Section 564(b)(1) of the Act, 21  U.S.C. section 360bbb-3(b)(1), unless the authorization is  terminated or revoked. Performed at [facility], [place] [address] [place], [place] [address]      Labs: BNP (last 3 results) No results for input(s): BNP in the last 8760 hours. Basic Metabolic  Panel: Recent Labs  Lab 08/17/19 1806 08/18/19 0639 08/19/19 0238 08/20/19 0312  NA 134* 139 141 142  K 3.9 4.3 3.8 3.9  CL 105 109 108 107  CO2 21* '22 24 25  ' GLUCOSE 276* 141* 126* 121*  BUN 8 5* 5* 10  CREATININE 0.79 0.80 0.73 0.81  CALCIUM 9.0 8.9 9.2 9.5  MG  --  2.2  --   --    Liver Function Tests: Recent Labs  Lab 08/17/19 1806 08/18/19 0639 08/19/19 0238 08/20/19 0312  AST 15 14* 14* 14*  ALT '18 16 17 16  ' ALKPHOS 105 95 98 93  BILITOT 0.7 0.6 0.7 0.8  PROT 6.5 6.1* 6.3* 6.6  ALBUMIN 3.9 3.5 3.6 3.8   Recent Labs  Lab 08/17/19 1806 08/18/19 0639 08/19/19 0238 08/20/19 0312  LIPASE 208* 80* 47 49   No results for input(s): AMMONIA in the last 168 hours. CBC: Recent Labs  Lab 08/17/19 1806 08/18/19 0639 08/19/19 0238 08/20/19 0312  WBC 14.7* 11.4* 10.2 10.6*  HGB 14.8 13.8 13.8 14.5  HCT 42.7 40.9 40.6 42.2  MCV 92.0 92.5 91.4 91.5  PLT  349 308 311 328   Cardiac Enzymes: No results for input(s): CKTOTAL, CKMB, CKMBINDEX, TROPONINI in the last 168 hours. BNP: Invalid input(s): POCBNP CBG: Recent Labs  Lab 08/19/19 2001 08/20/19 0042 08/20/19 0422 08/20/19 0759 08/20/19 1203  GLUCAP 227* 108* 132* 134* 107*   D-Dimer No results for input(s): DDIMER in the last 72 hours. Hgb A1c No results for input(s): HGBA1C in the last 72 hours. Lipid Profile No results for input(s): CHOL, HDL, LDLCALC, TRIG, CHOLHDL, LDLDIRECT in the last 72 hours. Thyroid function studies No results for input(s): TSH, T4TOTAL, T3FREE, THYROIDAB in the last 72 hours.  Invalid input(s): FREET3 Anemia work up No results for input(s): VITAMINB12, FOLATE, FERRITIN, TIBC, IRON, RETICCTPCT in the last 72 hours. Urinalysis    Component Value Date/Time   COLORURINE YELLOW 08/17/2019 1955   APPEARANCEUR CLEAR 08/17/2019 1955   LABSPEC 1.008 08/17/2019 1955   PHURINE 5.0 08/17/2019 1955   GLUCOSEU NEGATIVE 08/17/2019 1955   HGBUR MODERATE (A) 08/17/2019 1955    BILIRUBINUR NEGATIVE 08/17/2019 [place] NEGATIVE 08/17/2019 1955   PROTEINUR NEGATIVE 08/17/2019 1955   NITRITE NEGATIVE 08/17/2019 1955   LEUKOCYTESUR NEGATIVE 08/17/2019 1955   Sepsis Labs Invalid input(s): PROCALCITONIN,  WBC,  LACTICIDVEN Microbiology Recent Results (from the past 240 hour(s))  Respiratory Panel by RT PCR (Flu A&B, Covid) - Nasopharyngeal Swab     Status: None   Collection Time: 08/18/19 12:45 AM   Specimen: Nasopharyngeal Swab  Result Value Ref Range Status   SARS Coronavirus 2 by RT PCR NEGATIVE NEGATIVE Final    Comment: (NOTE) SARS-CoV-2 target nucleic acids are NOT DETECTED. The SARS-CoV-2 RNA is generally detectable in upper respiratoy specimens during the acute phase of infection. The lowest concentration of SARS-CoV-2 viral copies this assay can detect is 131 copies/mL. A negative result does not preclude SARS-Cov-2 infection and should not be used as the sole basis for treatment or other patient management decisions. A negative result may occur with  improper specimen collection/handling, submission of specimen other than nasopharyngeal swab, presence of viral mutation(s) within the areas targeted by this assay, and inadequate number of viral copies (<131 copies/mL). A negative result must be combined with clinical observations, patient history, and epidemiological information. The expected result is Negative. Fact Sheet for Patients:  PinkCheek.be Fact Sheet for Healthcare Providers:  GravelBags.it This test is not yet ap proved or cleared by the [place] FDA and  has been authorized for detection and/or diagnosis of SARS-CoV-2 by FDA under an Emergency Use Authorization (EUA). This EUA will remain  in effect (meaning this test can be used) for the duration of the COVID-19 declaration under Section 564(b)(1) of the Act, 21 U.S.C. section 360bbb-3(b)(1), unless the authorization  is terminated or revoked sooner.    Influenza A by PCR NEGATIVE NEGATIVE Final   Influenza B by PCR NEGATIVE NEGATIVE Final    Comment: (NOTE) The Xpert Xpress SARS-CoV-2/FLU/RSV assay is intended as an aid in  the diagnosis of influenza from Nasopharyngeal swab specimens and  should not be used as a sole basis for treatment. Nasal washings and  aspirates are unacceptable for Xpert Xpress SARS-CoV-2/FLU/RSV  testing. Fact Sheet for Patients: PinkCheek.be Fact Sheet for Healthcare Providers: GravelBags.it This test is not yet approved or cleared by the [place] FDA and  has been authorized for detection and/or diagnosis of SARS-CoV-2 by  FDA under an Emergency Use Authorization (EUA). This EUA will remain  in effect (meaning this test can be used)  for the duration of the  Covid-19 declaration under Section 564(b)(1) of the Act, 21  U.S.C. section 360bbb-3(b)(1), unless the authorization is  terminated or revoked. Performed at [facility], [place] [address] [place], [place] [address]      Time coordinating discharge: Over 33  Minutes.   SIGNED:   Hosie Poisson, MD  [facility] 08/21/2019, 12:28 PM

## 2019-12-05 IMAGING — CT CT HEAD W/O CM
4 series · 16 of 47 positions shown, 18 images · non-contrast
Comparison: December 11, 2012

CLINICAL DATA: Headache for 2 days.  Left-sided weakness.

EXAM:
CT HEAD WITHOUT CONTRAST
TECHNIQUE: Contiguous axial images were obtained from the base of the skull
through the vertex without intravenous contrast.

[Series 3: head wo · axial · 0.42mm/px · z∈[-148,-28]mm · 7 of 33 slices shown, 9 images]
[im 5/33  brain]
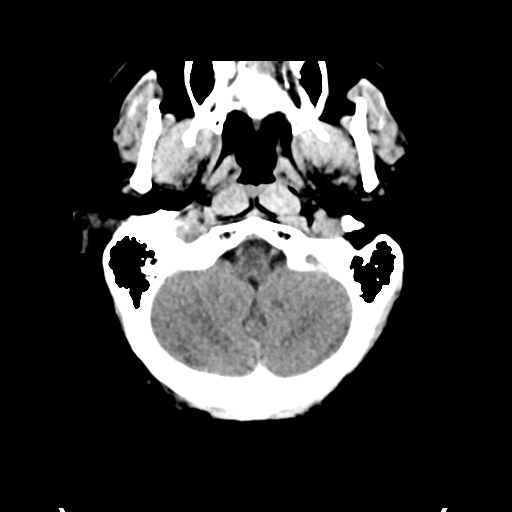
[im 5/33  bone]
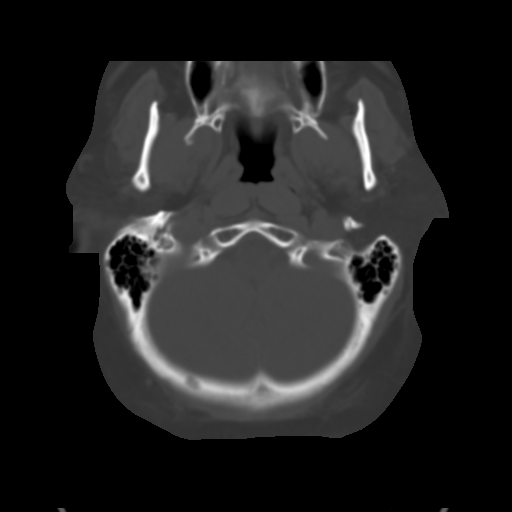
[im 9/33  brain]
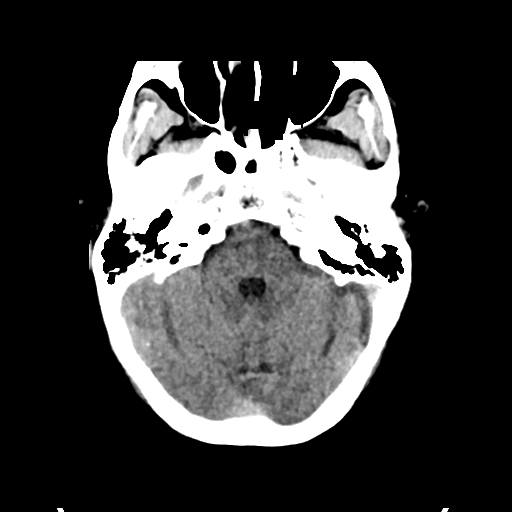
[im 13/33  brain]
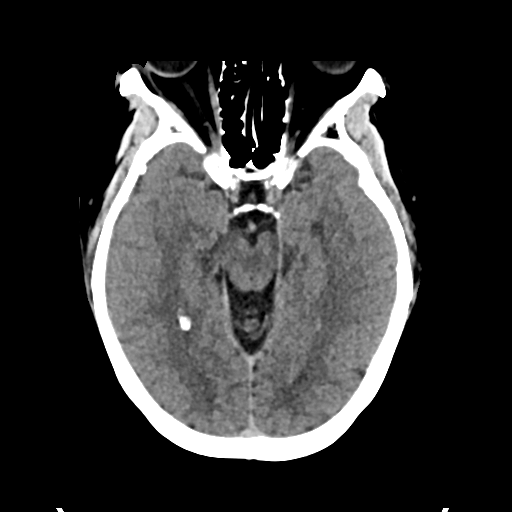
[im 17/33  brain]
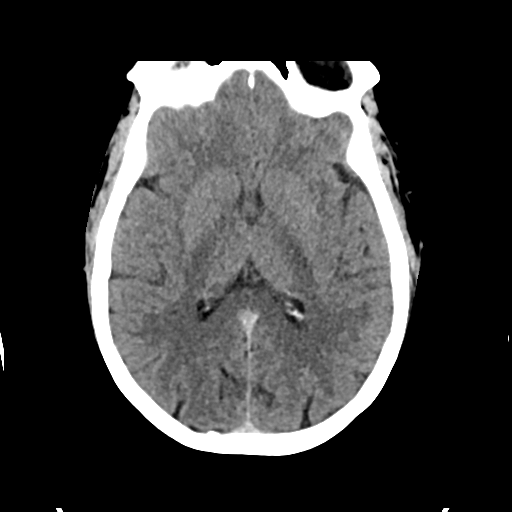
[im 21/33  brain]
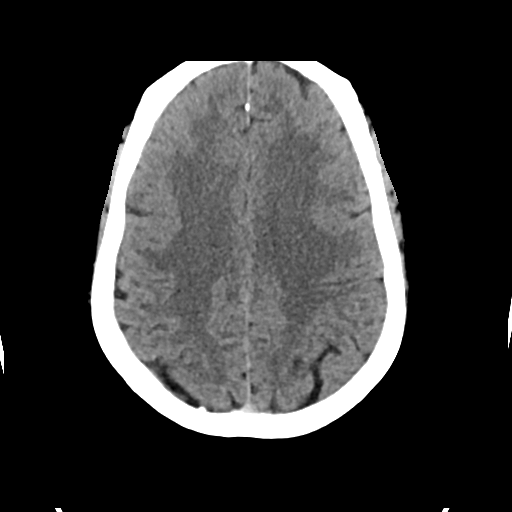
[im 21/33  bone]
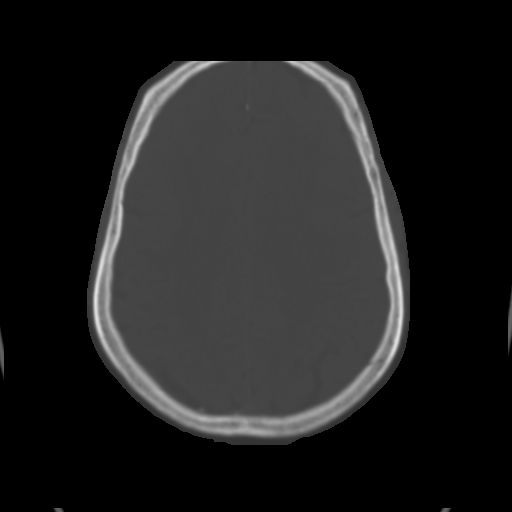
[im 25/33  brain]
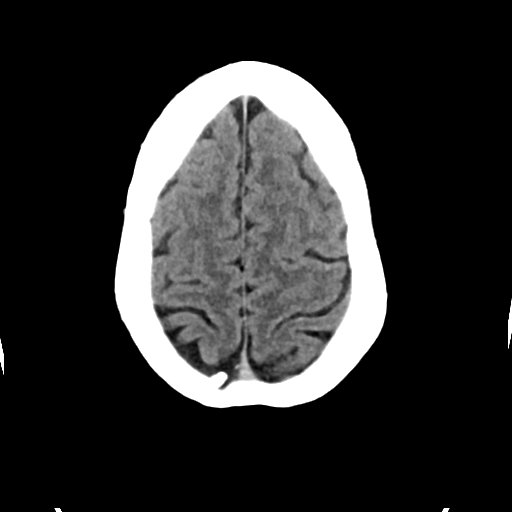
[im 29/33  brain]
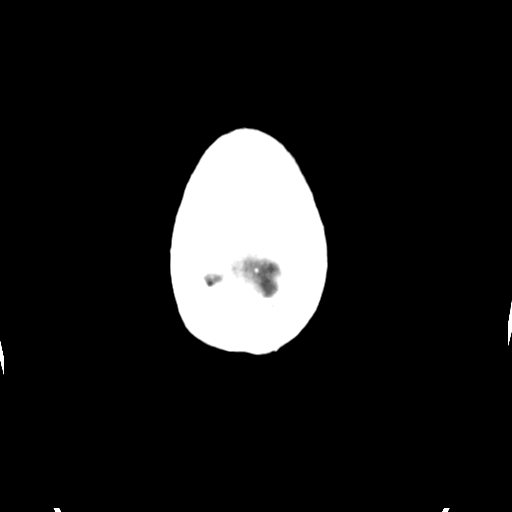

[Series 4: head bone · axial · 0.42mm/px · z∈[-152,-120]mm · 3 of 82 slices shown]
[im 9/82  bone]
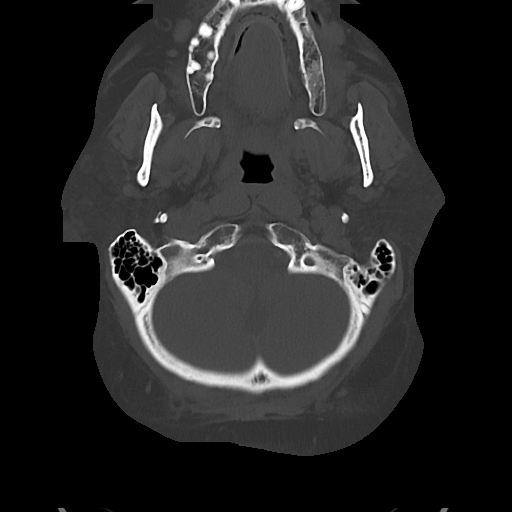
[im 17/82  bone]
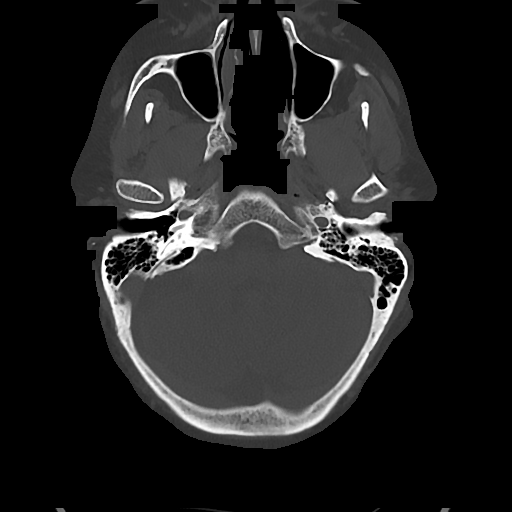
[im 25/82  bone]
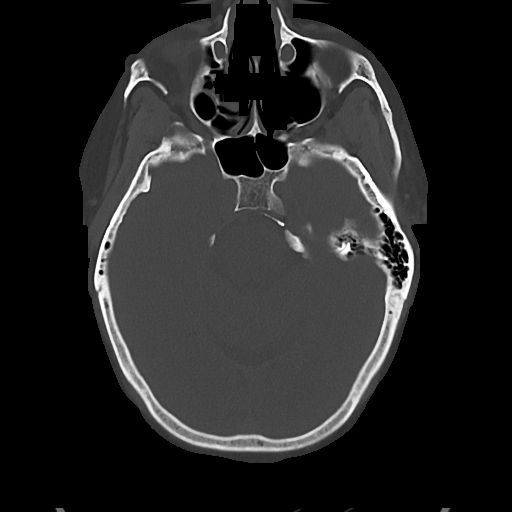

[Series 5: cor soft · coronal · 0.30mm/px · 3 of 67 slices shown]
[im 23/67  brain]
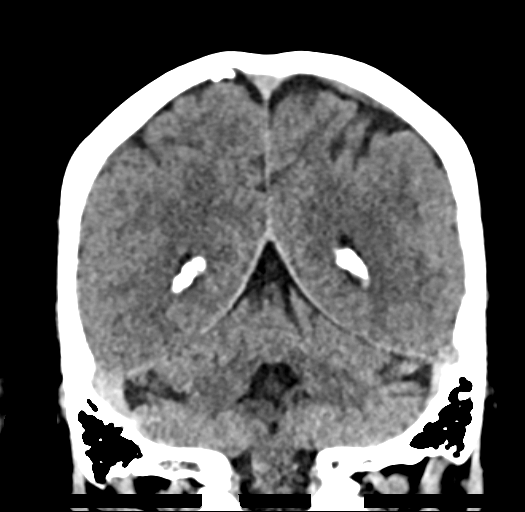
[im 30/67  brain]
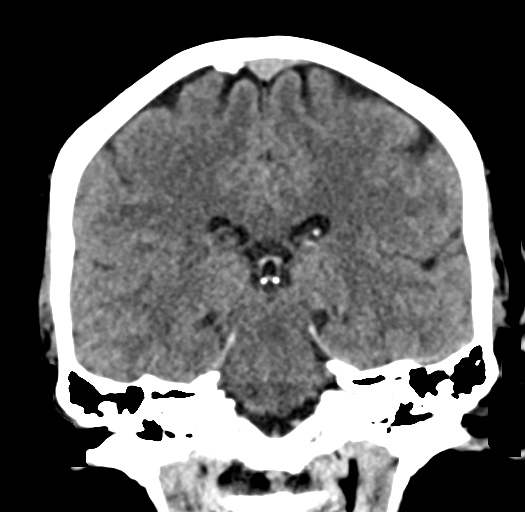
[im 37/67  brain]
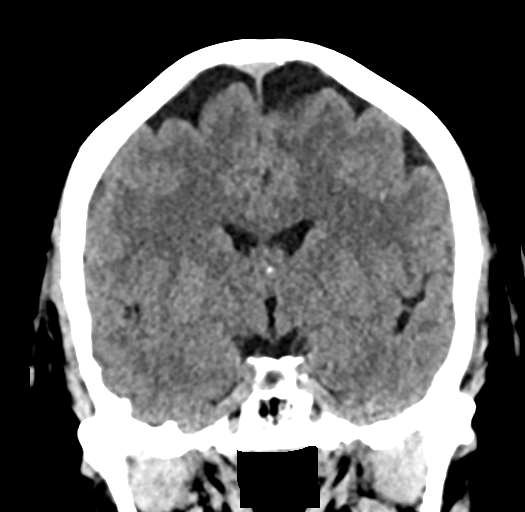

[Series 6: sag soft · sagittal · 0.29mm/px · 3 of 47 slices shown]
[im 16/47  brain]
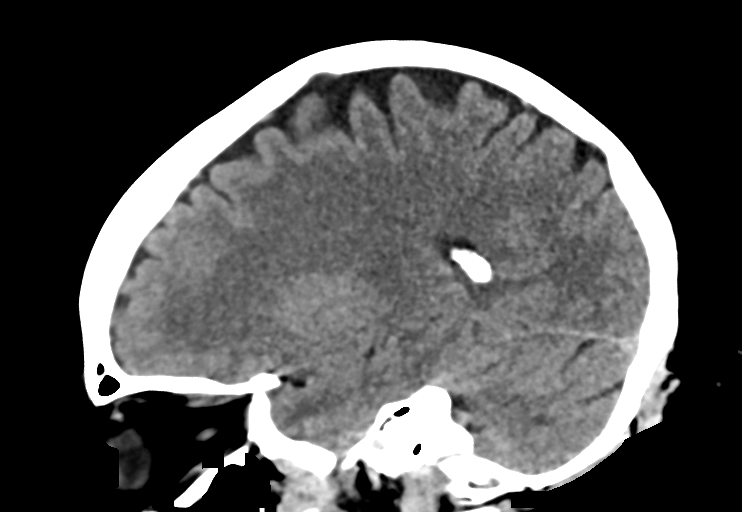
[im 24/47  brain]
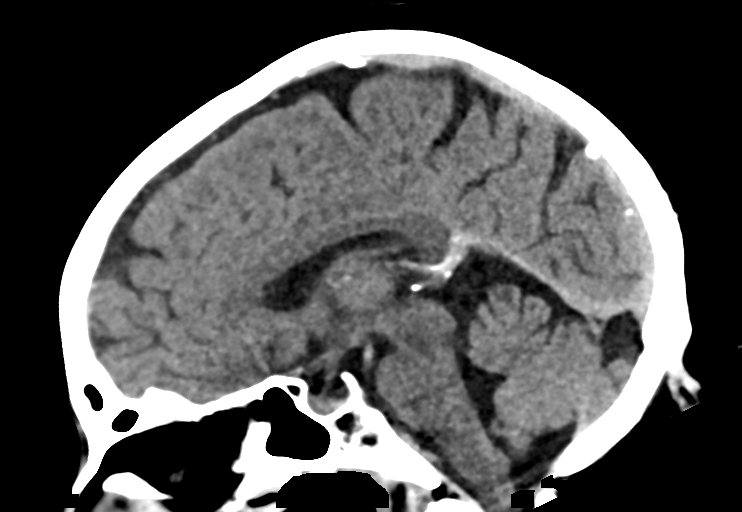
[im 31/47  brain]
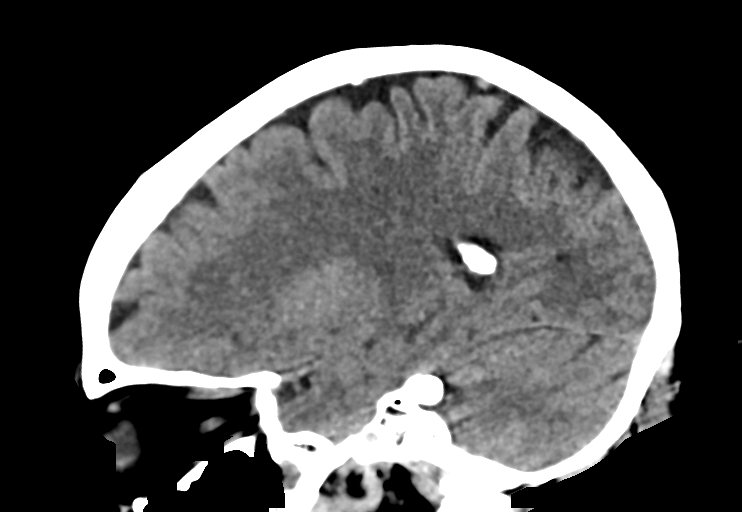

[16 of 47 positions shown; findings below may reference images not displayed]

FINDINGS: Brain: No subdural, epidural, or subarachnoid hemorrhage.
Cerebellum, brainstem, and basal cisterns are normal. Ventricles and
sulci are normal. No acute cortical ischemia or infarct. No mass
effect or midline shift.

Vascular: No hyperdense vessel or unexpected calcification.

Skull: Normal. Negative for fracture or focal lesion.

Sinuses/Orbits: No acute finding.

Other: None.
IMPRESSION: 1. No acute intracranial abnormalities.

## 2020-04-17 ENCOUNTER — Other Ambulatory Visit: Payer: 59

## 2020-06-26 ENCOUNTER — Encounter: Payer: Self-pay | Admitting: Gastroenterology

## 2021-03-14 ENCOUNTER — Encounter (HOSPITAL_COMMUNITY): Payer: Self-pay

## 2021-03-14 ENCOUNTER — Other Ambulatory Visit: Payer: Self-pay

## 2021-03-14 ENCOUNTER — Emergency Department (HOSPITAL_COMMUNITY)
Admission: EM | Admit: 2021-03-14 | Discharge: 2021-03-15 | Disposition: A | Discharge status: Home / Self Care | Payer: 59 | Attending: Emergency Medicine | Admitting: Emergency Medicine

## 2021-03-14 DIAGNOSIS — I10 Essential (primary) hypertension: Secondary | ICD-10-CM | POA: Diagnosis not present

## 2021-03-14 DIAGNOSIS — E119 Type 2 diabetes mellitus without complications: Secondary | ICD-10-CM | POA: Insufficient documentation

## 2021-03-14 DIAGNOSIS — F17210 Nicotine dependence, cigarettes, uncomplicated: Secondary | ICD-10-CM | POA: Insufficient documentation

## 2021-03-14 DIAGNOSIS — R002 Palpitations: Secondary | ICD-10-CM | POA: Diagnosis present

## 2021-03-14 DIAGNOSIS — Z79899 Other long term (current) drug therapy: Secondary | ICD-10-CM | POA: Diagnosis not present

## 2021-03-14 LAB — BASIC METABOLIC PANEL
Anion gap: 10 (ref 5–15)
BUN: 20 mg/dL (ref 6–20)
CO2: 23 mmol/L (ref 22–32)
Calcium: 9.8 mg/dL (ref 8.9–10.3)
Chloride: 104 mmol/L (ref 98–111)
Creatinine, Ser: 0.85 mg/dL (ref 0.44–1.00)
GFR, Estimated: >60 mL/min (ref >60)
Glucose, Bld: 159 mg/dL — ABNORMAL HIGH (ref 70–99)
Potassium: 4.1 mmol/L (ref 3.5–5.1)
Sodium: 137 mmol/L (ref 135–145)

## 2021-03-14 LAB — CBC WITH DIFFERENTIAL/PLATELET
Abs Immature Granulocytes: 0.08 10*3/uL — ABNORMAL HIGH (ref 0.00–0.07)
Basophils Absolute: 0.1 10*3/uL (ref 0.0–0.1)
Basophils Relative: 1 %
Eosinophils Absolute: 0.2 10*3/uL (ref 0.0–0.5)
Eosinophils Relative: 2 %
HCT: 48.8 % — ABNORMAL HIGH (ref 36.0–46.0)
Hemoglobin: 16.9 g/dL — ABNORMAL HIGH (ref 12.0–15.0)
Immature Granulocytes: 1 %
Lymphocytes Relative: 32 %
Lymphs Abs: 4.9 10*3/uL — ABNORMAL HIGH (ref 0.7–4.0)
MCH: 31.3 pg (ref 26.0–34.0)
MCHC: 34.6 g/dL (ref 30.0–36.0)
MCV: 90.4 fL (ref 80.0–100.0)
Monocytes Absolute: 0.8 10*3/uL (ref 0.1–1.0)
Monocytes Relative: 5 %
Neutro Abs: 9.2 10*3/uL — ABNORMAL HIGH (ref 1.7–7.7)
Neutrophils Relative %: 59 %
Platelets: 398 10*3/uL (ref 150–400)
RBC: 5.4 MIL/uL — ABNORMAL HIGH (ref 3.87–5.11)
RDW: 13.9 % (ref 11.5–15.5)
WBC: 15.4 10*3/uL — ABNORMAL HIGH (ref 4.0–10.5)
nRBC: 0 % (ref 0.0–0.2)

## 2021-03-14 NOTE — ED Provider Notes (Addendum)
Emergency Medicine Provider Triage Evaluation Note  Lisa [name] , a 50 y.o. female  was evaluated in triage.  Pt complains of palpitations since her husband passed on the fourth.  Is not being treated for anxiety.  Reports that the palpitations got worse over the past 2 days.  Review of Systems  Positive: Palpitations Negative: Dizziness or shortness of breath, chest pain  Physical Exam  BP (!) 165/112   Pulse (!) 107   Temp 98.3 F (36.8 C)   Resp (!) 27   LMP 01/28/2012   SpO2 98%  Gen:   Awake, no distress   Resp:  Normal effort  MSK:   Moves extremities without difficulty  Other:  Tachycardic, clear lung sounds  Medical Decision Making  Medically screening exam initiated at 8:41 PM.  Appropriate orders placed.  Lamae Fosco was informed that the remainder of the evaluation will be completed by another provider, this initial triage assessment does not replace that evaluation, and the importance of remaining in the ED until their evaluation is complete.     Rhae Hammock, PA-C 03/14/21 2042    Darliss Ridgel 03/14/21 2048    Drenda Freeze, MD 03/14/21 2350

## 2021-03-14 NOTE — ED Triage Notes (Signed)
Pt reports multiple stressors and grieving at home. Sts a heart doing "cartwheels" sensation for 3 or 4 days.

## 2021-03-15 ENCOUNTER — Emergency Department (HOSPITAL_COMMUNITY): Payer: 59

## 2021-03-15 LAB — TROPONIN I (HIGH SENSITIVITY): Troponin I (High Sensitivity): 3 ng/L (ref <18)

## 2021-03-15 LAB — CBG MONITORING, ED: Glucose-Capillary: 166 mg/dL — ABNORMAL HIGH (ref 70–99)

## 2021-03-15 NOTE — Discharge Instructions (Signed)
You were evaluated in the Emergency Department and after careful evaluation, we did not find any emergent condition requiring admission or further testing in the hospital.  Your work-up today was overall reassuring.  Your labs did show that you was likely dehydrated which could be contributing to your sensation of palpitations.  Overall your EKG did not show any acute findings and your work-up was overall reassuring.  Please make sure to drink plenty of fluids during the stressful time.  Please make sure to follow-up with your primary care doctor for this as well.  Return to the ER if you have worsening chest pain, dizziness, worsening palpitations, or any other new concerning symptoms.   Thank you for allowing [place] to be a part of your care.

## 2021-03-15 NOTE — ED Notes (Signed)
Pt states she is lightheaded and is diabetic, but refuses to get CBG.

## 2021-03-15 NOTE — ED Provider Notes (Signed)
[facility] DEPT Provider Note   CSN: [id number] Arrival date & time: 03/14/21  2011     History Chief Complaint  Patient presents with   Palpitations    Lisa [name] is a 50 y.o. female.  HPI 50 year old female with a history of DM type II, diverticulitis, hypertension, IBS presents to the ER with complaints of palpitations.  Patient states that she feels like her heart is "doing cart wheels".  This has been intermittent for the last several days.  She does endorse losing her husband 4 days ago and originally was attributing the symptoms to that.  She states she continues to have sensation in her chest.  She has had poor p.o. intake in the last several days.  She denies any actual chest pain or shortness of breath, though she does feel dizzy at times.  Denies any syncope.  No prior cardiac history outside of hypertension.    Past Medical History:  Diagnosis Date   Diabetes mellitus without complication ([place])    Diverticulitis    Hypertension    IBS (irritable bowel syndrome)     Patient Active Problem List   Diagnosis Date Noted   Acute pancreatitis 08/18/2019   Acute diverticulitis 03/11/2015   Leukocytosis 03/11/2015   Tachycardia 03/11/2015   Tobacco use 03/11/2015   Obesity 03/11/2015   Diabetes mellitus without complication (HCC)    Hypertension    IBS (irritable bowel syndrome)     Past Surgical History:  Procedure Laterality Date   ABDOMINAL HYSTERECTOMY       OB History   No obstetric history on file.     Family History  Problem Relation Age of Onset   COPD Mother    Congestive Heart Failure Father    Diabetes Brother    Irritable bowel syndrome Other        Constipation    Colon cancer Paternal Grandmother 56    Social History   Tobacco Use   Smoking status: Every Day    Packs/day: 1.00    Years: 20.00    Pack years: 20.00    Types: Cigarettes   Smokeless tobacco: Never  Substance Use Topics   Alcohol  use: No    Alcohol/week: 0.0 standard drinks   Drug use: No    Home Medications Prior to Admission medications   Medication Sig Start Date End Date Taking? Authorizing Provider  amLODipine (NORVASC) 5 MG tablet Take 1 tablet (5 mg total) by mouth daily. 08/21/19   Hosie Poisson, MD  ascorbic acid (VITAMIN C) 500 MG tablet Take 500-1,000 mg by mouth daily.    [provider]  blood glucose meter kit and supplies KIT Dispense based on patient and insurance preference. Use up to four times daily as directed. (FOR ICD-9 250.00, 250.01). 12/04/15   Waynetta Pean, PA-C  famotidine (PEPCID) 20 MG tablet Take 1 tablet (20 mg total) by mouth 2 (two) times daily for 10 days. 08/20/19 08/30/19  Hosie Poisson, MD  losartan (COZAAR) 100 MG tablet Take 1 tablet (100 mg total) by mouth daily. 08/20/19   Hosie Poisson, MD  OZEMPIC, 0.25 OR 0.5 MG/DOSE, 2 MG/1.5ML SOPN Inject 0.5 mg into the skin every Saturday.  12/11/17   [provider]  senna-docusate (SENOKOT-S) 8.6-50 MG tablet Take 1 tablet by mouth at bedtime as needed for mild constipation. 08/20/19   Hosie Poisson, MD    Allergies    Hemophilus b polysaccharide vaccine, Influenza virus vaccine, Metformin, Metformin and related, Semaglutide(0.25  or 0.16m-dos), Zolpidem tartrate, and Zolpidem  Review of Systems   Review of Systems Ten systems reviewed and are negative for acute change, except as noted in the HPI.   Physical Exam Updated Vital Signs BP 106/82 (BP Location: Left Arm)   Pulse 80   Temp 98.2 F (36.8 C) (Oral)   Resp 17   LMP 01/28/2012   SpO2 98%   Physical Exam Vitals and nursing note reviewed.  Constitutional:      General: She is not in acute distress.    Appearance: She is well-developed.  HENT:     Head: Normocephalic and atraumatic.  Eyes:     Conjunctiva/sclera: Conjunctivae normal.  Cardiovascular:     Rate and Rhythm: Normal rate and regular rhythm.     Heart sounds: No murmur heard. Pulmonary:      Effort: Pulmonary effort is normal. No respiratory distress.     Breath sounds: Normal breath sounds.  Abdominal:     Palpations: Abdomen is soft.     Tenderness: There is no abdominal tenderness.  Musculoskeletal:        General: No swelling.     Cervical back: Neck supple.  Skin:    General: Skin is warm and dry.     Capillary Refill: Capillary refill takes less than 2 seconds.  Neurological:     Mental Status: She is alert.  Psychiatric:        Mood and Affect: Mood normal.    ED Results / Procedures / Treatments   Labs (all labs ordered are listed, but only abnormal results are displayed) Labs Reviewed  CBC WITH DIFFERENTIAL/PLATELET - Abnormal; Notable for the following components:      Result Value   WBC 15.4 (*)    RBC 5.40 (*)    Hemoglobin 16.9 (*)    HCT 48.8 (*)    Neutro Abs 9.2 (*)    Lymphs Abs 4.9 (*)    Abs Immature Granulocytes 0.08 (*)    All other components within normal limits  BASIC METABOLIC PANEL - Abnormal; Notable for the following components:   Glucose, Bld 159 (*)    All other components within normal limits  CBG MONITORING, ED - Abnormal; Notable for the following components:   Glucose-Capillary 166 (*)    All other components within normal limits  TROPONIN I (HIGH SENSITIVITY)    EKG None  Radiology DG Chest 2 View  Result Date: 03/15/2021 CLINICAL DATA:  Palpitations. EXAM: CHEST - 2 VIEW COMPARISON:  09/08/2008 FINDINGS: The heart size and mediastinal contours are within normal limits. Both lungs are clear. The visualized skeletal structures are unremarkable. IMPRESSION: No active cardiopulmonary disease. Electronically Signed   By: TKerby MoorsM.D.   On: 03/15/2021 06:35    Procedures Procedures   Medications Ordered in ED Medications - No data to display  ED Course  I have reviewed the triage vital signs and the nursing notes.  Pertinent labs & imaging results that were available during my care of the patient were reviewed  by me and considered in my medical decision making (see chart for details).    MDM Rules/Calculators/A&P                          50year old female presents to the ER with complaints of palpitations.  On arrival, she is well-appearing, no acute distress, resting comfortably in the ER bed.  Urgently tachycardic with a rate of 107, hypertensive with a  blood pressure 165/112, however this improved throughout the ED course.  Heart rate of 82 on my exam.  Lung sounds clear.  No murmurs rubs or gallops.  Patient was placed on a continuous EKG monitor on arrival, EKG sinus tachycardia on arrival to the ED.  No evidence of tachycardia or arrhythmia with continuous monitoring.  CBC with a leukocytosis of 15.4, elevated hemoglobin 16.9, hematocrit 48.8, likely secondary to hemoconcentration as patient states she said poor p.o. intake over the last few days.  BMP largely unremarkable.  Troponin of 3.  Patient states that this has been ongoing for the last 3 or 4 days, low suspicion for ACS and I do not think she needs an additional troponin at this time.  Chest x-ray unremarkable.  No significant electrode abnormalities to suggest arrhythmia.  Suspect this may be secondary to anxiety/grief/possible hypovolemia.  No evidence of Takotsubo's.  Repleted fluids orally here in the ED.  Encouraged increasing p.o. intake.  Encouraged PCP follow-up.  We discussed return precautions.  She was understanding and is agreeable.  Stable for discharge.  Final Clinical Impression(s) / ED Diagnoses Final diagnoses:  Palpitations    Rx / DC Orders ED Discharge Orders     None        Garald Balding, PA-C 03/15/21 [id number]    Orpah Greek, MD 03/15/21 [phone]

## 2021-10-23 ENCOUNTER — Emergency Department (HOSPITAL_COMMUNITY): Payer: Self-pay

## 2021-10-23 ENCOUNTER — Encounter (HOSPITAL_COMMUNITY): Payer: Self-pay | Admitting: Emergency Medicine

## 2021-10-23 ENCOUNTER — Emergency Department (HOSPITAL_COMMUNITY)
Admission: EM | Admit: 2021-10-23 | Discharge: 2021-10-23 | Disposition: A | Discharge status: Home / Self Care | Payer: Self-pay | Attending: Emergency Medicine | Admitting: Emergency Medicine

## 2021-10-23 DIAGNOSIS — Y92410 Unspecified street and highway as the place of occurrence of the external cause: Secondary | ICD-10-CM | POA: Diagnosis not present

## 2021-10-23 DIAGNOSIS — S0990XA Unspecified injury of head, initial encounter: Secondary | ICD-10-CM | POA: Insufficient documentation

## 2021-10-23 DIAGNOSIS — R0789 Other chest pain: Secondary | ICD-10-CM | POA: Insufficient documentation

## 2021-10-23 DIAGNOSIS — Z79899 Other long term (current) drug therapy: Secondary | ICD-10-CM | POA: Diagnosis not present

## 2021-10-23 DIAGNOSIS — E119 Type 2 diabetes mellitus without complications: Secondary | ICD-10-CM | POA: Insufficient documentation

## 2021-10-23 DIAGNOSIS — Z7984 Long term (current) use of oral hypoglycemic drugs: Secondary | ICD-10-CM | POA: Insufficient documentation

## 2021-10-23 DIAGNOSIS — R000 Tachycardia, unspecified: Secondary | ICD-10-CM | POA: Diagnosis not present

## 2021-10-23 DIAGNOSIS — M25552 Pain in left hip: Secondary | ICD-10-CM | POA: Insufficient documentation

## 2021-10-23 DIAGNOSIS — M79622 Pain in left upper arm: Secondary | ICD-10-CM | POA: Insufficient documentation

## 2021-10-23 DIAGNOSIS — I10 Essential (primary) hypertension: Secondary | ICD-10-CM | POA: Insufficient documentation

## 2021-10-23 DIAGNOSIS — Z794 Long term (current) use of insulin: Secondary | ICD-10-CM | POA: Diagnosis not present

## 2021-10-23 HISTORY — DX: Post-traumatic stress disorder, unspecified: F43.10

## 2021-10-23 LAB — CBC
HCT: 45.1 % (ref 36.0–46.0)
Hemoglobin: 16 g/dL — ABNORMAL HIGH (ref 12.0–15.0)
MCH: 31 pg (ref 26.0–34.0)
MCHC: 35.5 g/dL (ref 30.0–36.0)
MCV: 87.4 fL (ref 80.0–100.0)
Platelets: 373 10*3/uL (ref 150–400)
RBC: 5.16 MIL/uL — ABNORMAL HIGH (ref 3.87–5.11)
RDW: 14.1 % (ref 11.5–15.5)
WBC: 13.9 10*3/uL — ABNORMAL HIGH (ref 4.0–10.5)
nRBC: 0 % (ref 0.0–0.2)

## 2021-10-23 LAB — SAMPLE TO BLOOD BANK

## 2021-10-23 LAB — I-STAT CHEM 8, ED
BUN: 13 mg/dL (ref 6–20)
Calcium, Ion: 1.19 mmol/L (ref 1.15–1.40)
Chloride: 106 mmol/L (ref 98–111)
Creatinine, Ser: 0.8 mg/dL (ref 0.44–1.00)
Glucose, Bld: 118 mg/dL — ABNORMAL HIGH (ref 70–99)
HCT: 45 % (ref 36.0–46.0)
Hemoglobin: 15.3 g/dL — ABNORMAL HIGH (ref 12.0–15.0)
Potassium: 3.7 mmol/L (ref 3.5–5.1)
Sodium: 139 mmol/L (ref 135–145)
TCO2: 18 mmol/L — ABNORMAL LOW (ref 22–32)

## 2021-10-23 LAB — COMPREHENSIVE METABOLIC PANEL
ALT: 25 U/L (ref 0–44)
AST: 24 U/L (ref 15–41)
Albumin: 4.4 g/dL (ref 3.5–5.0)
Alkaline Phosphatase: 107 U/L (ref 38–126)
Anion gap: 16 — ABNORMAL HIGH (ref 5–15)
BUN: 13 mg/dL (ref 6–20)
CO2: 19 mmol/L — ABNORMAL LOW (ref 22–32)
Calcium: 10.1 mg/dL (ref 8.9–10.3)
Chloride: 104 mmol/L (ref 98–111)
Creatinine, Ser: 0.88 mg/dL (ref 0.44–1.00)
GFR, Estimated: >60 mL/min (ref >60)
Glucose, Bld: 114 mg/dL — ABNORMAL HIGH (ref 70–99)
Potassium: 3.7 mmol/L (ref 3.5–5.1)
Sodium: 139 mmol/L (ref 135–145)
Total Bilirubin: 0.6 mg/dL (ref 0.3–1.2)
Total Protein: 7.6 g/dL (ref 6.5–8.1)

## 2021-10-23 LAB — PROTIME-INR
INR: 1 (ref 0.8–1.2)
Prothrombin Time: 12.6 seconds (ref 11.4–15.2)

## 2021-10-23 LAB — ETHANOL: Alcohol, Ethyl (B): <10 mg/dL (ref <10)

## 2021-10-23 LAB — I-STAT BETA HCG BLOOD, ED (MC, WL, AP ONLY): I-stat hCG, quantitative: <5 m[IU]/mL (ref <5)

## 2021-10-23 LAB — LACTIC ACID, PLASMA: Lactic Acid, Venous: 1.4 mmol/L (ref 0.5–1.9)

## 2021-10-23 MED ORDER — IOHEXOL 350 MG/ML SOLN
80.0000 mL | Freq: Once | INTRAVENOUS | Status: AC | PRN
  Administered 2021-10-23: 80 mL via INTRAVENOUS

## 2021-10-23 MED ORDER — ONDANSETRON HCL 4 MG/2ML IJ SOLN
4.0000 mg | Freq: Once | INTRAMUSCULAR | Status: DC
  Filled 2021-10-23: qty 2

## 2021-10-23 MED ORDER — KETOROLAC TROMETHAMINE 15 MG/ML IJ SOLN
15.0000 mg | Freq: Once | INTRAMUSCULAR | Status: DC
  Filled 2021-10-23: qty 1

## 2021-10-23 MED ORDER — ACETAMINOPHEN 325 MG PO TABS
650.0000 mg | ORAL_TABLET | Freq: Once | ORAL | Status: AC
  Administered 2021-10-23: 650 mg via ORAL
  Filled 2021-10-23: qty 2

## 2021-10-23 MED ORDER — FENTANYL CITRATE PF 50 MCG/ML IJ SOSY
50.0000 ug | PREFILLED_SYRINGE | Freq: Once | INTRAMUSCULAR | Status: DC
  Filled 2021-10-23: qty 1
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
## 2021-10-23 NOTE — Discharge Instructions (Signed)
Take Tylenol as needed at home for continued soreness.  Return to the emergency department for any new or worsening symptoms of concern.
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
## 2021-10-23 NOTE — ED Notes (Signed)
Patient verbalizes understanding of discharge instructions. Opportunity for questioning and answers were provided. Armband removed by staff, pt discharged from ED.  

## 2022-03-30 ENCOUNTER — Emergency Department (HOSPITAL_COMMUNITY)
Admission: EM | Admit: 2022-03-30 | Discharge: 2022-03-30 | Disposition: A | Discharge status: Home / Self Care | Payer: BC Managed Care – PPO | Attending: Emergency Medicine | Admitting: Emergency Medicine

## 2022-03-30 ENCOUNTER — Emergency Department (HOSPITAL_COMMUNITY): Payer: BC Managed Care – PPO

## 2022-03-30 ENCOUNTER — Encounter (HOSPITAL_COMMUNITY): Payer: Self-pay

## 2022-03-30 ENCOUNTER — Other Ambulatory Visit: Payer: Self-pay

## 2022-03-30 DIAGNOSIS — R1032 Left lower quadrant pain: Secondary | ICD-10-CM | POA: Diagnosis present

## 2022-03-30 DIAGNOSIS — K529 Noninfective gastroenteritis and colitis, unspecified: Secondary | ICD-10-CM

## 2022-03-30 DIAGNOSIS — J101 Influenza due to other identified influenza virus with other respiratory manifestations: Secondary | ICD-10-CM | POA: Insufficient documentation

## 2022-03-30 DIAGNOSIS — J111 Influenza due to unidentified influenza virus with other respiratory manifestations: Secondary | ICD-10-CM

## 2022-03-30 DIAGNOSIS — Z20822 Contact with and (suspected) exposure to covid-19: Secondary | ICD-10-CM | POA: Insufficient documentation

## 2022-03-30 LAB — COMPREHENSIVE METABOLIC PANEL WITH GFR
ALT: 33 U/L (ref 0–44)
AST: 26 U/L (ref 15–41)
Albumin: 4 g/dL (ref 3.5–5.0)
Alkaline Phosphatase: 114 U/L (ref 38–126)
Anion gap: 9 (ref 5–15)
BUN: 12 mg/dL (ref 6–20)
CO2: 22 mmol/L (ref 22–32)
Calcium: 8.8 mg/dL — ABNORMAL LOW (ref 8.9–10.3)
Chloride: 100 mmol/L (ref 98–111)
Creatinine, Ser: 0.63 mg/dL (ref 0.44–1.00)
GFR, Estimated: >60 mL/min
Glucose, Bld: 341 mg/dL — ABNORMAL HIGH (ref 70–99)
Potassium: 4.2 mmol/L (ref 3.5–5.1)
Sodium: 131 mmol/L — ABNORMAL LOW (ref 135–145)
Total Bilirubin: 0.7 mg/dL (ref 0.3–1.2)
Total Protein: 7.4 g/dL (ref 6.5–8.1)

## 2022-03-30 LAB — CBC WITH DIFFERENTIAL/PLATELET
Abs Immature Granulocytes: 0.03 10*3/uL (ref 0.00–0.07)
Basophils Absolute: 0 10*3/uL (ref 0.0–0.1)
Basophils Relative: 0 %
Eosinophils Absolute: 0 10*3/uL (ref 0.0–0.5)
Eosinophils Relative: 0 %
HCT: 41.1 % (ref 36.0–46.0)
Hemoglobin: 14.4 g/dL (ref 12.0–15.0)
Immature Granulocytes: 0 %
Lymphocytes Relative: 12 %
Lymphs Abs: 0.8 10*3/uL (ref 0.7–4.0)
MCH: 31 pg (ref 26.0–34.0)
MCHC: 35 g/dL (ref 30.0–36.0)
MCV: 88.6 fL (ref 80.0–100.0)
Monocytes Absolute: 0.4 10*3/uL (ref 0.1–1.0)
Monocytes Relative: 6 %
Neutro Abs: 5.5 10*3/uL (ref 1.7–7.7)
Neutrophils Relative %: 82 %
Platelets: 198 10*3/uL (ref 150–400)
RBC: 4.64 MIL/uL (ref 3.87–5.11)
RDW: 13.3 % (ref 11.5–15.5)
WBC: 6.8 10*3/uL (ref 4.0–10.5)
nRBC: 0 % (ref 0.0–0.2)

## 2022-03-30 LAB — URINALYSIS, ROUTINE W REFLEX MICROSCOPIC
Bacteria, UA: NONE SEEN
Bilirubin Urine: NEGATIVE
Glucose, UA: >=500 mg/dL — AB
Ketones, ur: 5 mg/dL — AB
Leukocytes,Ua: NEGATIVE
Nitrite: NEGATIVE
Protein, ur: 30 mg/dL — AB
Specific Gravity, Urine: 1.044 — ABNORMAL HIGH (ref 1.005–1.030)
pH: 6 (ref 5.0–8.0)

## 2022-03-30 LAB — RESP PANEL BY RT-PCR (RSV, FLU A&B, COVID)  RVPGX2
Influenza A by PCR: POSITIVE — AB
Influenza B by PCR: NEGATIVE
Resp Syncytial Virus by PCR: NEGATIVE
SARS Coronavirus 2 by RT PCR: NEGATIVE

## 2022-03-30 LAB — LIPASE, BLOOD: Lipase: 38 U/L (ref 11–51)

## 2022-03-30 MED ORDER — HYDROMORPHONE HCL 1 MG/ML IJ SOLN
1.0000 mg | Freq: Once | INTRAMUSCULAR | Status: AC
  Administered 2022-03-30: 1 mg via INTRAVENOUS
  Filled 2022-03-30: qty 1

## 2022-03-30 MED ORDER — SODIUM CHLORIDE 0.9 % IV BOLUS: 500.0000 mL | Freq: Once | INTRAVENOUS | Status: DC

## 2022-03-30 MED ORDER — HYDROCODONE-ACETAMINOPHEN 5-325 MG PO TABS: 1.0000 | ORAL_TABLET | ORAL | 0 refills | Status: DC | PRN

## 2022-03-30 MED ORDER — SODIUM CHLORIDE 0.9 % IV SOLN
1.5000 g | Freq: Once | INTRAVENOUS | Status: AC
  Administered 2022-03-30: 1.5 g via INTRAVENOUS
  Filled 2022-03-30: qty 4

## 2022-03-30 MED ORDER — ONDANSETRON HCL 4 MG/2ML IJ SOLN
4.0000 mg | Freq: Once | INTRAMUSCULAR | Status: AC
  Administered 2022-03-30: 4 mg via INTRAVENOUS
  Filled 2022-03-30: qty 2

## 2022-03-30 MED ORDER — AMOXICILLIN-POT CLAVULANATE 875-125 MG PO TABS: 1.0000 | ORAL_TABLET | Freq: Two times a day (BID) | ORAL | 0 refills | Status: DC

## 2022-03-30 MED ORDER — LACTATED RINGERS IV BOLUS
500.0000 mL | Freq: Once | INTRAVENOUS | Status: AC
  Administered 2022-03-30: 500 mL via INTRAVENOUS

## 2022-03-30 MED ORDER — IOHEXOL 300 MG/ML  SOLN
100.0000 mL | Freq: Once | INTRAMUSCULAR | Status: AC | PRN
Start: 2022-03-30 — End: 2022-03-30
  Administered 2022-03-30: 100 mL via INTRAVENOUS

## 2022-03-30 NOTE — ED Triage Notes (Signed)
Pt arrived via POV c/o abdominal pain, SOB, body aches and chills since this past Tuesday.

## 2022-03-30 NOTE — ED Provider Notes (Signed)
[facility] Provider Note   CSN: [id number] Arrival date & time: 03/30/22  0255     History  Chief Complaint  Patient presents with   Abdominal Pain    Lisa [name] is a 51 y.o. female.  Presents to the emergency department for evaluation of left lower quadrant abdominal pain.  Patient reports that symptoms began tonight.  She does have a history of diverticulosis.  Patient did have associated nausea and vomiting tonight.  She has been ill for the last few days with nasal congestion, cold symptoms.       Home Medications Prior to Admission medications   Medication Sig Start Date End Date Taking? Authorizing Provider  amoxicillin-clavulanate (AUGMENTIN) 875-125 MG tablet Take 1 tablet by mouth every 12 (twelve) hours. 03/30/22  Yes Timya Trimmer, Gwenyth Allegra, MD  HYDROcodone-acetaminophen (NORCO/VICODIN) 5-325 MG tablet Take 1 tablet by mouth every 4 (four) hours as needed for moderate pain. 03/30/22  Yes Yuridiana Formanek, Gwenyth Allegra, MD  acetaminophen (TYLENOL) 500 MG tablet Take 1,000 mg by mouth every 6 (six) hours as needed (arthritis pain).    [provider]  amLODipine (NORVASC) 10 MG tablet Take 10 mg by mouth every morning. 08/31/21   [provider]  blood glucose meter kit and supplies KIT Dispense based on patient and insurance preference. Use up to four times daily as directed. (FOR ICD-9 250.00, 250.01). 12/04/15   Waynetta Pean, PA-C  buPROPion (WELLBUTRIN XL) 150 MG 24 hr tablet Take 150 mg by mouth every morning. 09/18/21   [provider]  DULoxetine (CYMBALTA) 60 MG capsule Take 120 mg by mouth every morning. 09/18/21   [provider]  empagliflozin (JARDIANCE) 25 MG TABS tablet Take 25 mg by mouth every morning.    [provider]  insulin glargine, 1 Unit Dial, (TOUJEO SOLOSTAR) 300 UNIT/ML Solostar Pen Inject 25 Units into the skin daily as needed (CBG >200).    [provider]  losartan (COZAAR)  100 MG tablet Take 1 tablet (100 mg total) by mouth daily. Patient taking differently: Take 100 mg by mouth every morning. 08/20/19   Hosie Poisson, MD  rosuvastatin (CRESTOR) 10 MG tablet Take 10 mg by mouth every morning. 10/04/21   [provider]  traZODone (DESYREL) 100 MG tablet Take 100 mg by mouth at bedtime as needed for sleep. 09/03/21   [provider]      Allergies    Haemophilus b polysaccharide vaccine, Influenza virus vaccine, Ibuprofen, Metformin, Semaglutide(0.25 or 0.29m-dos), and Zolpidem    Review of Systems   Review of Systems  Physical Exam Updated Vital Signs BP (!) 130/92   Pulse 80   Temp 97.8 F (36.6 C) (Oral)   Resp 17   Ht 5' 8" (1.727 m)   Wt 104 kg   LMP 01/28/2012   SpO2 97%   BMI 34.86 kg/m  Physical Exam Vitals and nursing note reviewed.  Constitutional:      General: She is not in acute distress.    Appearance: She is well-developed.  HENT:     Head: Normocephalic and atraumatic.     Mouth/Throat:     Mouth: Mucous membranes are moist.  Eyes:     General: Vision grossly intact. Gaze aligned appropriately.     Extraocular Movements: Extraocular movements intact.     Conjunctiva/sclera: Conjunctivae normal.  Cardiovascular:     Rate and Rhythm: Normal rate and regular rhythm.     Pulses: Normal pulses.  Heart sounds: Normal heart sounds, S1 normal and S2 normal. No murmur heard.    No friction rub. No gallop.  Pulmonary:     Effort: Pulmonary effort is normal. No respiratory distress.     Breath sounds: Normal breath sounds.  Abdominal:     General: Bowel sounds are normal.     Palpations: Abdomen is soft.     Tenderness: There is generalized abdominal tenderness and tenderness in the left lower quadrant. There is no guarding or rebound.     Hernia: No hernia is present.  Musculoskeletal:        General: No swelling.     Cervical back: Full passive range of motion without pain, normal range of motion and neck  supple. No spinous process tenderness or muscular tenderness. Normal range of motion.     Right lower leg: No edema.     Left lower leg: No edema.  Skin:    General: Skin is warm and dry.     Capillary Refill: Capillary refill takes less than 2 seconds.     Findings: No ecchymosis, erythema, rash or wound.  Neurological:     General: No focal deficit present.     Mental Status: She is alert and oriented to person, place, and time.     GCS: GCS eye subscore is 4. GCS verbal subscore is 5. GCS motor subscore is 6.     Cranial Nerves: Cranial nerves 2-12 are intact.     Sensory: Sensation is intact.     Motor: Motor function is intact.     Coordination: Coordination is intact.  Psychiatric:        Attention and Perception: Attention normal.        Mood and Affect: Mood normal.        Speech: Speech normal.        Behavior: Behavior normal.     ED Results / Procedures / Treatments   Labs (all labs ordered are listed, but only abnormal results are displayed) Labs Reviewed  RESP PANEL BY RT-PCR (RSV, FLU A&B, COVID)  [id number] - Abnormal; Notable for the following components:      Result Value   Influenza A by PCR POSITIVE (*)    All other components within normal limits  COMPREHENSIVE METABOLIC PANEL - Abnormal; Notable for the following components:   Sodium 131 (*)    Glucose, Bld 341 (*)    Calcium 8.8 (*)    All other components within normal limits  CBC WITH DIFFERENTIAL/PLATELET  LIPASE, BLOOD  URINALYSIS, ROUTINE W REFLEX MICROSCOPIC    EKG None  Radiology CT ABDOMEN PELVIS W CONTRAST  Result Date: 03/30/2022 CLINICAL DATA:  51 year old female with left lower quadrant abdominal pain that woke her from sleep. EXAM: CT ABDOMEN AND PELVIS WITH CONTRAST TECHNIQUE: Multidetector CT imaging of the abdomen and pelvis was performed using the standard protocol following bolus administration of intravenous contrast. RADIATION DOSE REDUCTION: This exam was performed according to  the departmental dose-optimization program which includes automated exposure control, adjustment of the mA and/or kV according to patient size and/or use of iterative reconstruction technique. CONTRAST:  143m OMNIPAQUE IOHEXOL 300 MG/ML  SOLN COMPARISON:  CT Abdomen and Pelvis 10/23/2021, 01/01/2019. FINDINGS: Lower chest: Negative; minimal costophrenic angle atelectasis or scarring. Hepatobiliary: Possible chronic hepatic steatosis. Otherwise negative liver and gallbladder. Pancreas: Negative. Spleen: Stable and negative. Adrenals/Urinary Tract: Chronic mild left adrenal gland thickening is stable since 2020 compatible with benign hyperplasia. Renal enhancement and contrast excretion appears  symmetric and normal. Small chronic right upper pole benign renal cyst (no follow-up imaging recommended). Ureters are decompressed. Unremarkable bladder. Chronic pelvic phleboliths. Stomach/Bowel: Abundant large bowel retained stool. Redundant colon. Redundant splenic flexure with abnormal fairly long segment large bowel wall thickening, indistinct appearance, and effaced lumen versus fluid within the bowel. Abnormality begins on series 2, image 37 and continues through image 29 into the proximal descending colon. A segment of greater than 15 cm is involved. Upstream abundant retained stool in proximal transverse, redundant hepatic flexure and right colon. Appendix remains normal on series 2, image 62. Cecum is on a lax mesentery near the mid abdomen. No dilated small bowel. Stomach is decompressed. There is a relatively large 5 cm midline retroperitoneal duodenal diverticulum on series 6, image 69, without active inflammation. No free air. No free fluid identified. Vascular/Lymphatic: Aortoiliac calcified atherosclerosis. Mildly tortuous abdominal aorta. Major arterial structures are patent. No lymphadenopathy identified. Portal venous system appears grossly patent. Reproductive: Chronically absent uterus, diminutive or  absent ovaries. Other: No pelvic free fluid. Musculoskeletal: No acute osseous abnormality identified. Lower thoracic spina bifida occulta through L1, normal variant. IMPRESSION: 1. Highly redundant large bowel with abundant retained stool. Superimposed long segment thickened and edematous colon from the distal transverse through the redundant splenic flexure with fluid in the lumen is compatible with nonspecific Acute Colitis. Burtis Junes this is infectious colitis. No free fluid or other complicating features. 2. No other acute or inflammatory process identified in the abdomen or pelvis. Duodenal diverticula. Aortic Atherosclerosis (ICD10-I70.0). Electronically Signed   By: Genevie Ann M.D.   On: 03/30/2022 06:19   DG Chest Port 1 View  Result Date: 03/30/2022 CLINICAL DATA:  51 year old female with cough. Shortness of breath. Abdominal pain, nausea vomiting. EXAM: PORTABLE CHEST 1 VIEW COMPARISON:  CT Chest, Abdomen, and Pelvis 10/23/2021 and earlier. FINDINGS: Portable AP upright view at 0411 hours. Mildly lower lung volumes. Normal cardiac size and mediastinal contours. Visualized tracheal air column is within normal limits. Allowing for portable technique the lungs are clear. No pneumothorax or pleural effusion. Paucity of bowel gas in the visible abdomen. No acute osseous abnormality identified. IMPRESSION: Negative portable chest. Electronically Signed   By: Genevie Ann M.D.   On: 03/30/2022 05:20    Procedures Procedures    Medications Ordered in ED Medications  ampicillin-sulbactam (UNASYN) 1.5 g in sodium chloride 0.9 % 100 mL IVPB (has no administration in time range)  HYDROmorphone (DILAUDID) injection 1 mg (has no administration in time range)  HYDROmorphone (DILAUDID) injection 1 mg (1 mg Intravenous Given 03/30/22 0420)  ondansetron (ZOFRAN) injection 4 mg (4 mg Intravenous Given 03/30/22 0420)  lactated ringers bolus 500 mL (0 mLs Intravenous Stopped 03/30/22 0535)  iohexol (OMNIPAQUE) 300 MG/ML  solution 100 mL (100 mLs Intravenous Contrast Given 03/30/22 0522)    ED Course/ Medical Decision Making/ A&P                           Medical Decision Making Amount and/or Complexity of Data Reviewed Labs: ordered. Radiology: ordered.  Risk Prescription drug management.   Patient presents to the emergency department for evaluation of multiple problems.  Patient with flulike URI symptoms for several days.  She has not noticed progress from a respiratory standpoint.  She is positive for influenza which explains the URI symptoms.  She really came to the ER tonight, however, when she developed fairly sudden onset upper abdominal pain.  She has had a  history of diverticulitis and is concerned that this may be related to eating popcorn.  She did have a CT scan that did show some inflammation of the transverse colon consistent with colitis, unclear if it is diverticulitis or not.  Will treat with antibiotics, hydration, pain medication.  Administered Unasyn in the department and will discharge with Augmentin.  She has a gastroenterologist that she can follow-up with.  Symptomatic treatment for her flu.        Final Clinical Impression(s) / ED Diagnoses Final diagnoses:  Influenza  Colitis    Rx / DC Orders ED Discharge Orders          Ordered    amoxicillin-clavulanate (AUGMENTIN) 875-125 MG tablet  Every 12 hours        03/30/22 0859    HYDROcodone-acetaminophen (NORCO/VICODIN) 5-325 MG tablet  Every 4 hours PRN        03/30/22 0859              Orpah Greek, MD 03/30/22 [phone]

## 2022-03-30 NOTE — ED Notes (Signed)
Pt c/o LLQ pain that awoke her from sleep, pt had BM yesterday, reported normal, pt has hx of diverticulosis, but hasn't had flare up in "a while". Pt reports pain feels similar starting LLQ and moving across lower abd- pt says she did eat popcorn yesterday, pt reports pain is intermittent but when it hits, she gets nauseated and has vomited bile 3 times in the past few hours.

## 2022-03-30 NOTE — ED Notes (Signed)
ED Provider at bedside. 

## 2022-05-28 ENCOUNTER — Encounter: Payer: Self-pay | Admitting: Gastroenterology

## 2022-06-28 ENCOUNTER — Other Ambulatory Visit: Payer: Self-pay | Admitting: Internal Medicine

## 2022-06-28 DIAGNOSIS — Z1231 Encounter for screening mammogram for malignant neoplasm of breast: Secondary | ICD-10-CM

## 2022-07-09 ENCOUNTER — Ambulatory Visit
Admission: RE | Admit: 2022-07-09 | Discharge: 2022-07-09 | Disposition: A | Discharge status: Home / Self Care | Payer: BC Managed Care – PPO | Source: Ambulatory Visit | Attending: Internal Medicine | Admitting: Internal Medicine

## 2022-07-09 DIAGNOSIS — Z1231 Encounter for screening mammogram for malignant neoplasm of breast: Secondary | ICD-10-CM

## 2023-06-10 ENCOUNTER — Other Ambulatory Visit: Payer: Self-pay | Admitting: Internal Medicine

## 2023-06-10 DIAGNOSIS — Z1231 Encounter for screening mammogram for malignant neoplasm of breast: Secondary | ICD-10-CM

## 2023-06-17 ENCOUNTER — Inpatient Hospital Stay: Admission: RE | Admit: 2023-06-17 | Payer: Self-pay | Source: Ambulatory Visit

## 2023-07-03 ENCOUNTER — Inpatient Hospital Stay

## 2023-07-03 ENCOUNTER — Inpatient Hospital Stay: Attending: Oncology | Admitting: Oncology

## 2023-07-03 VITALS — BP 127/81 | HR 85 | Temp 98.3°F | Resp 18 | Ht 68.0 in | Wt 221.3 lb

## 2023-07-03 DIAGNOSIS — D72829 Elevated white blood cell count, unspecified: Secondary | ICD-10-CM | POA: Diagnosis present

## 2023-07-03 DIAGNOSIS — D72820 Lymphocytosis (symptomatic): Secondary | ICD-10-CM

## 2023-07-03 DIAGNOSIS — Z79899 Other long term (current) drug therapy: Secondary | ICD-10-CM

## 2023-07-03 DIAGNOSIS — Z800 Family history of malignant neoplasm of digestive organs: Secondary | ICD-10-CM | POA: Diagnosis not present

## 2023-07-03 DIAGNOSIS — R630 Anorexia: Secondary | ICD-10-CM

## 2023-07-03 DIAGNOSIS — Z7984 Long term (current) use of oral hypoglycemic drugs: Secondary | ICD-10-CM

## 2023-07-03 DIAGNOSIS — Z887 Allergy status to serum and vaccine status: Secondary | ICD-10-CM

## 2023-07-03 DIAGNOSIS — R61 Generalized hyperhidrosis: Secondary | ICD-10-CM | POA: Diagnosis not present

## 2023-07-03 DIAGNOSIS — M2550 Pain in unspecified joint: Secondary | ICD-10-CM | POA: Diagnosis not present

## 2023-07-03 DIAGNOSIS — Z6833 Body mass index (BMI) 33.0-33.9, adult: Secondary | ICD-10-CM

## 2023-07-03 DIAGNOSIS — Z886 Allergy status to analgesic agent status: Secondary | ICD-10-CM | POA: Diagnosis not present

## 2023-07-03 DIAGNOSIS — E669 Obesity, unspecified: Secondary | ICD-10-CM

## 2023-07-03 DIAGNOSIS — Z720 Tobacco use: Secondary | ICD-10-CM

## 2023-07-03 DIAGNOSIS — F17210 Nicotine dependence, cigarettes, uncomplicated: Secondary | ICD-10-CM | POA: Diagnosis not present

## 2023-07-03 LAB — COMPREHENSIVE METABOLIC PANEL
ALT: 20 U/L (ref 0–44)
AST: 17 U/L (ref 15–41)
Albumin: 4.3 g/dL (ref 3.5–5.0)
Alkaline Phosphatase: 90 U/L (ref 38–126)
Anion gap: 13 (ref 5–15)
BUN: 13 mg/dL (ref 6–20)
CO2: 20 mmol/L — ABNORMAL LOW (ref 22–32)
Calcium: 9.6 mg/dL (ref 8.9–10.3)
Chloride: 103 mmol/L (ref 98–111)
Creatinine, Ser: 0.74 mg/dL (ref 0.44–1.00)
GFR, Estimated: >60 mL/min (ref >60)
Glucose, Bld: 117 mg/dL — ABNORMAL HIGH (ref 70–99)
Potassium: 3.8 mmol/L (ref 3.5–5.1)
Sodium: 136 mmol/L (ref 135–145)
Total Bilirubin: 1 mg/dL (ref 0.0–1.2)
Total Protein: 7.8 g/dL (ref 6.5–8.1)

## 2023-07-03 LAB — CBC WITH DIFFERENTIAL/PLATELET
Abs Immature Granulocytes: 0.06 10*3/uL (ref 0.00–0.07)
Basophils Absolute: 0.1 10*3/uL (ref 0.0–0.1)
Basophils Relative: 1 %
Eosinophils Absolute: 0.3 10*3/uL (ref 0.0–0.5)
Eosinophils Relative: 2 %
HCT: 46.2 % — ABNORMAL HIGH (ref 36.0–46.0)
Hemoglobin: 15.9 g/dL — ABNORMAL HIGH (ref 12.0–15.0)
Immature Granulocytes: 0 %
Lymphocytes Relative: 29 %
Lymphs Abs: 4.5 10*3/uL — ABNORMAL HIGH (ref 0.7–4.0)
MCH: 31.2 pg (ref 26.0–34.0)
MCHC: 34.4 g/dL (ref 30.0–36.0)
MCV: 90.6 fL (ref 80.0–100.0)
Monocytes Absolute: 0.9 10*3/uL (ref 0.1–1.0)
Monocytes Relative: 6 %
Neutro Abs: 9.4 10*3/uL — ABNORMAL HIGH (ref 1.7–7.7)
Neutrophils Relative %: 62 %
Platelets: 375 10*3/uL (ref 150–400)
RBC: 5.1 MIL/uL (ref 3.87–5.11)
RDW: 13.9 % (ref 11.5–15.5)
WBC: 15.2 10*3/uL — ABNORMAL HIGH (ref 4.0–10.5)
nRBC: 0 % (ref 0.0–0.2)

## 2023-07-03 LAB — LACTATE DEHYDROGENASE: LDH: 105 U/L (ref 98–192)

## 2023-07-03 LAB — URIC ACID: Uric Acid, Serum: 4.1 mg/dL (ref 2.5–7.1)

## 2023-07-03 LAB — SEDIMENTATION RATE: Sed Rate: 6 mm/h (ref 0–22)

## 2023-07-03 LAB — C-REACTIVE PROTEIN: CRP: 0.6 mg/dL (ref <1.0)

## 2023-07-03 NOTE — Assessment & Plan Note (Signed)
 Patient with a BMI of 33.6.  Likely contributing to leukocytosis. -Encouraged weight loss

## 2023-07-03 NOTE — Progress Notes (Signed)
 [facility] HEMATOLOGY NEW VISIT  Lisa Peri, MD  REASON FOR REFERRAL: Leukocytosis   HISTORY OF PRESENT ILLNESS: Lisa [name] 53 y.o. female referred for leukocytosis.  Patient is a chronic smoker and has known that her white blood cell count has been elevated forever.  On chart review, patient has had leukocytosis since at least 2012.  She was evaluated by heme-onc at [facility] in 2023, and was thought that it was because of smoking.  I could not find a flow cytometry report though.   Patient states that she has been feeling well overall.  Denies fever, chills, weight loss.  She stated that she does have some night sweats and some loss of appetite.  She has been a chronic smoker for several years and has been trying to cut down but states that it has just been so difficult.  She denies abdominal pain.  She does have a history of colitis and diverticulitis.  Reports some joint pains but no rashes.  No lymphadenopathy and fatigue.  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  She reports a family history of pancreatic cancer in grandmother and father.  She believes her father had genetic testing done.   I have reviewed the past medical history, past surgical history, social history and family history with the patient   ALLERGIES:  is allergic to haemophilus b polysaccharide vaccine, influenza virus vaccine, ibuprofen, metformin, semaglutide(0.25 or 0.5mg -dos), and zolpidem.  MEDICATIONS:  Current Outpatient Medications  Medication Sig Dispense Refill   acetaminophen (TYLENOL) 500 MG tablet Take 1,000 mg by mouth every 6 (six) hours as needed (arthritis pain).     amLODipine (NORVASC) 10 MG tablet Take 10 mg by mouth every morning.     blood glucose meter kit and supplies KIT Dispense based on patient and insurance preference. Use up to four times daily as directed. (FOR ICD-9 250.00, 250.01). 1 each 0   Continuous Glucose Sensor  (DEXCOM G7 SENSOR) MISC Inject 1 sensor to the skin every 10 days for continuous glucose monitoring.     DULoxetine (CYMBALTA) 60 MG capsule Take 120 mg by mouth every morning.     FARXIGA 10 MG TABS tablet Take 10 mg by mouth daily.     gabapentin (NEURONTIN) 300 MG capsule Take 1 capsule by mouth at bedtime.     HYDROcodone-acetaminophen (NORCO/VICODIN) 5-325 MG tablet Take 1 tablet by mouth every 4 (four) hours as needed for moderate pain. 10 tablet 0   Insulin Glargine (BASAGLAR KWIKPEN) 100 UNIT/ML SMARTSIG:30 Unit(s) SUB-Q Daily     losartan (COZAAR) 100 MG tablet Take 1 tablet (100 mg total) by mouth daily. (Patient taking differently: Take 100 mg by mouth every morning.) 30 tablet 0   pregabalin (LYRICA) 75 MG capsule Take 75-150 mg by mouth at bedtime.     No current facility-administered medications for this visit.     REVIEW OF SYSTEMS:   Constitutional: Denies fevers, chills or night sweats Eyes: Denies blurriness of vision Ears, nose, mouth, throat, and face: Denies mucositis or sore throat Respiratory: Denies cough, dyspnea or wheezes Cardiovascular: Denies palpitation, chest discomfort or lower extremity swelling Gastrointestinal:  Denies nausea, heartburn or change in bowel habits Skin: Denies abnormal skin rashes Lymphatics: Denies new lymphadenopathy or easy bruising Neurological:Denies numbness, tingling or new weaknesses Behavioral/Psych: Mood is stable, no new changes  All other systems were reviewed with the patient and are negative.  PHYSICAL EXAMINATION:   Vitals:  07/03/23 1055  BP: 127/81  Pulse: 85  Resp: 18  Temp: 98.3 F (36.8 C)  SpO2: 99%    GENERAL:alert, no distress and comfortable SKIN: skin color, texture, turgor are normal, no rashes or significant lesions LYMPH:  no palpable lymphadenopathy in the cervical, axillary or inguinal LUNGS: clear to auscultation and percussion with normal breathing effort HEART: regular rate & rhythm and no  murmurs and no lower extremity edema ABDOMEN:abdomen soft, non-tender and normal bowel sounds Musculoskeletal:no cyanosis of digits and no clubbing  NEURO: alert & oriented x 3 with fluent speech  LABORATORY DATA:  I have reviewed the data as listed and labs from primary care office from 05/19/2023: CBC: WBC: 13, hemoglobin: 10.4, hematocrit: 46.4, MCV: 91, platelets: 332, ANC: 7200, ANC: 4800  CBC from 05/21/2022: WBC:10.7, hemoglobin: 14.2, hematocrit: 42.1 platelets: 354: ALC: 3200  Lab Results  Component Value Date   WBC 15.2 (H) 07/03/2023   NEUTROABS 9.4 (H) 07/03/2023   HGB 15.9 (H) 07/03/2023   HCT 46.2 (H) 07/03/2023   MCV 90.6 07/03/2023   PLT 375 07/03/2023      Chemistry      Component Value Date/Time   NA 136 07/03/2023 1116   K 3.8 07/03/2023 1116   CL 103 07/03/2023 1116   CO2 20 (L) 07/03/2023 1116   BUN 13 07/03/2023 1116   CREATININE 0.74 07/03/2023 1116      Component Value Date/Time   CALCIUM 9.6 07/03/2023 1116   ALKPHOS 90 07/03/2023 1116   AST 17 07/03/2023 1116   ALT 20 07/03/2023 1116   BILITOT 1.0 07/03/2023 1116       ASSESSMENT & PLAN:  Patient is a 53 year old female referred for leukocytosis  Leukocytosis Chronic leukocytosis since 2011. Differential includes smoking, obesity, stress, chronic inflammation, CLL. No B symptoms.  -Will do blood work today to rule out inflammation and flow cytometry to CLL  To clinic in 2 weeks to discuss results  Tobacco use Current smoker, contributing to leukocytosis. Difficulty quitting despite attempts. - Encouraged smoking cessation.  Obesity Patient with a BMI of 33.6.  Likely contributing to leukocytosis. -Encouraged weight loss   Orders Placed This Encounter  Procedures   CBC with Differential/Platelet    Standing Status:   Future    Number of Occurrences:   1    Expected Date:   07/03/2023    Expiration Date:   07/02/2024   Comprehensive metabolic panel    Standing Status:   Future     Number of Occurrences:   1    Expected Date:   07/03/2023    Expiration Date:   07/02/2024   Lactate dehydrogenase    Standing Status:   Future    Number of Occurrences:   1    Expected Date:   07/03/2023    Expiration Date:   07/02/2024   Uric acid    Standing Status:   Future    Number of Occurrences:   1    Expected Date:   07/03/2023    Expiration Date:   07/02/2024   Flow Cytometry, Peripheral Blood (Oncology)    Standing Status:   Future    Number of Occurrences:   1    Expected Date:   07/03/2023    Expiration Date:   07/02/2024   Sedimentation rate    Standing Status:   Future    Number of Occurrences:   1    Expected Date:   07/03/2023    Expiration Date:  07/02/2024   C-reactive protein    Standing Status:   Future    Number of Occurrences:   1    Expected Date:   07/03/2023    Expiration Date:   07/02/2024    The total time spent in the appointment was 40 minutes encounter with patients including review of chart and various tests results, discussions about plan of care and coordination of care plan   All questions were answered. The patient knows to call the clinic with any problems, questions or concerns. No barriers to learning was detected.   Cindie Crumbly, MD 3/20/20251:30 PM

## 2023-07-03 NOTE — Patient Instructions (Signed)
 VISIT SUMMARY:  You came in today to discuss your elevated white blood cell count, which has been an ongoing issue for several years. We also talked about your night sweats, smoking habits, weight, and anxiety. You mentioned occasional joint pain and abdominal pain, as well as a history of diverticulitis and colitis, which are currently under control. We reviewed your family history of pancreatic cancer and your work-related stress.  YOUR PLAN:  -LEUKOCYTOSIS: Leukocytosis means having a higher than normal white blood cell count, which can be due to various factors like smoking, obesity, stress, or chronic inflammation. We will conduct blood tests to rule out inflammation and chronic lymphocytic leukemia (CLL). We will discuss the results in two weeks.  -SMOKING: Smoking can contribute to elevated white blood cell count and other health issues. We encourage you to quit smoking and will support you in your efforts to do so.  -OBESITY: Being slightly overweight can also contribute to an elevated white blood cell count. We recommend working on weight loss through a healthy diet and regular exercise.  -ANXIETY: Anxiety can increase stress, which may affect your white blood cell count. We encourage you to practice relaxation techniques and stress management to help reduce anxiety.  INSTRUCTIONS:  Please complete the blood tests as discussed, and we will review the results in two weeks. If your night sweats persist or worsen, please let us know. Continue working on smoking cessation, weight loss, and stress management. Follow up with [place] if you have any new or worsening symptoms.

## 2023-07-03 NOTE — Assessment & Plan Note (Signed)
 Chronic leukocytosis since 2011. Differential includes smoking, obesity, stress, chronic inflammation, CLL. No B symptoms.  -Will do blood work today to rule out inflammation and flow cytometry to CLL  To clinic in 2 weeks to discuss results

## 2023-07-03 NOTE — Assessment & Plan Note (Signed)
 Current smoker, contributing to leukocytosis. Difficulty quitting despite attempts. - Encouraged smoking cessation.

## 2023-07-04 LAB — SURGICAL PATHOLOGY

## 2023-07-10 LAB — FLOW CYTOMETRY

## 2023-07-15 ENCOUNTER — Ambulatory Visit
Admission: RE | Admit: 2023-07-15 | Discharge: 2023-07-15 | Disposition: A | Discharge status: Home / Self Care | Source: Ambulatory Visit | Attending: Internal Medicine | Admitting: Internal Medicine

## 2023-07-15 DIAGNOSIS — Z1231 Encounter for screening mammogram for malignant neoplasm of breast: Secondary | ICD-10-CM

## 2023-07-17 ENCOUNTER — Inpatient Hospital Stay: Attending: Oncology | Admitting: Oncology

## 2023-07-17 VITALS — BP 139/97 | HR 89 | Temp 98.7°F | Resp 20 | Wt 225.4 lb

## 2023-07-17 DIAGNOSIS — Z79899 Other long term (current) drug therapy: Secondary | ICD-10-CM | POA: Diagnosis not present

## 2023-07-17 DIAGNOSIS — E66811 Obesity, class 1: Secondary | ICD-10-CM | POA: Diagnosis not present

## 2023-07-17 DIAGNOSIS — Z886 Allergy status to analgesic agent status: Secondary | ICD-10-CM | POA: Insufficient documentation

## 2023-07-17 DIAGNOSIS — Z6833 Body mass index (BMI) 33.0-33.9, adult: Secondary | ICD-10-CM

## 2023-07-17 DIAGNOSIS — E669 Obesity, unspecified: Secondary | ICD-10-CM | POA: Insufficient documentation

## 2023-07-17 DIAGNOSIS — D72825 Bandemia: Secondary | ICD-10-CM | POA: Diagnosis not present

## 2023-07-17 DIAGNOSIS — D72829 Elevated white blood cell count, unspecified: Secondary | ICD-10-CM | POA: Insufficient documentation

## 2023-07-17 DIAGNOSIS — Z887 Allergy status to serum and vaccine status: Secondary | ICD-10-CM | POA: Insufficient documentation

## 2023-07-17 DIAGNOSIS — R5383 Other fatigue: Secondary | ICD-10-CM | POA: Insufficient documentation

## 2023-07-17 DIAGNOSIS — Z720 Tobacco use: Secondary | ICD-10-CM

## 2023-07-17 DIAGNOSIS — F17210 Nicotine dependence, cigarettes, uncomplicated: Secondary | ICD-10-CM | POA: Insufficient documentation

## 2023-07-17 DIAGNOSIS — R509 Fever, unspecified: Secondary | ICD-10-CM | POA: Diagnosis not present

## 2023-07-17 DIAGNOSIS — R630 Anorexia: Secondary | ICD-10-CM | POA: Diagnosis not present

## 2023-07-17 DIAGNOSIS — R634 Abnormal weight loss: Secondary | ICD-10-CM | POA: Insufficient documentation

## 2023-07-17 NOTE — Progress Notes (Signed)
 [facility] at [facility]  HEMATOLOGY FOLLOW-UP VISIT  Lisa Peri, MD  REASON FOR FOLLOW-UP: Leukocytosis  ASSESSMENT & PLAN:  Patient is a 53 y.o. female following for leukocytosis  Leukocytosis Chronic leukocytosis since 2011.  Likely secondary to obesity and tobacco use.  Flow cytometry negative.  ESR and CRP normal. - Continue to monitor counts for now - Advised smoking cessation. - Instructed to report new symptoms such as fever, chills, unexplained weight loss, anorexia, or severe fatigue.  Follow-up in six months with labs for reassessment.  Tobacco use Smoking less than a pack daily, actively attempting cessation. - Supported smoking cessation efforts.  Obesity Obesity likely contributing to leukocytosis. - Encouraged weight loss through dietary modifications and physical activity.   Orders Placed This Encounter  Procedures   CBC with Differential/Platelet    Standing Status:   Future    Expected Date:   01/14/2024    Expiration Date:   07/16/2024   Comprehensive metabolic panel with GFR    Standing Status:   Future    Expected Date:   01/14/2024    Expiration Date:   07/16/2024    The total time spent in the appointment was 20 minutes encounter with patients including review of chart and various tests results, discussions about plan of care and coordination of care plan   All questions were answered. The patient knows to call the clinic with any problems, questions or concerns. No barriers to learning was detected.  Cindie Crumbly, MD 4/3/202512:36 PM   SUMMARY OF HEMATOLOGIC HISTORY: Chronic leukocytosis since at least 2011 -ESR, CRP: Normal -Flow cytometry: Negative -Risk factors: Tobacco smoking, obesity   INTERVAL HISTORY: Lisa [name] 53 y.o. female following for leukocytosis.  Patient reported some fatigue today but attributed it to starting a new job at [employer] which is her second job.  She has no other complaints today.   She denies fevers, chills, night sweats, weight loss, or loss of appetite.  Patient continues to smoke but is currently cutting down.  Is smoking 1 pack/3 days.   I have reviewed the past medical history, past surgical history, social history and family history with the patient   ALLERGIES:  is allergic to haemophilus b polysaccharide vaccine, influenza virus vaccine, ibuprofen, metformin, semaglutide(0.25 or 0.5mg -dos), and zolpidem.  MEDICATIONS:  Current Outpatient Medications  Medication Sig Dispense Refill   acetaminophen (TYLENOL) 500 MG tablet Take 1,000 mg by mouth every 6 (six) hours as needed (arthritis pain).     amLODipine (NORVASC) 10 MG tablet Take 10 mg by mouth every morning.     blood glucose meter kit and supplies KIT Dispense based on patient and insurance preference. Use up to four times daily as directed. (FOR ICD-9 250.00, 250.01). 1 each 0   Continuous Glucose Sensor (DEXCOM G7 SENSOR) MISC Inject 1 sensor to the skin every 10 days for continuous glucose monitoring.     DULoxetine (CYMBALTA) 60 MG capsule Take 120 mg by mouth every morning.     FARXIGA 10 MG TABS tablet Take 10 mg by mouth daily.     gabapentin (NEURONTIN) 300 MG capsule Take 1 capsule by mouth at bedtime.     Insulin Glargine (BASAGLAR KWIKPEN) 100 UNIT/ML SMARTSIG:30 Unit(s) SUB-Q Daily     losartan (COZAAR) 100 MG tablet Take 1 tablet (100 mg total) by mouth daily. (Patient taking differently: Take 100 mg by mouth every morning.) 30 tablet 0   pregabalin (LYRICA) 75 MG capsule Take 75-150 mg  by mouth at bedtime.     No current facility-administered medications for this visit.     REVIEW OF SYSTEMS:   Constitutional: Denies fevers, chills or night sweats Eyes: Denies blurriness of vision Ears, nose, mouth, throat, and face: Denies mucositis or sore throat Respiratory: Denies cough, dyspnea or wheezes Cardiovascular: Denies palpitation, chest discomfort or lower extremity  swelling Gastrointestinal:  Denies nausea, heartburn or change in bowel habits Skin: Denies abnormal skin rashes Lymphatics: Denies new lymphadenopathy or easy bruising Neurological:Denies numbness, tingling or new weaknesses Behavioral/Psych: Mood is stable, no new changes  All other systems were reviewed with the patient and are negative.  PHYSICAL EXAMINATION:   Vitals:   07/17/23 0934  BP: (!) 139/97  Pulse: 89  Resp: 20  Temp: 98.7 F (37.1 C)  SpO2: 100%    GENERAL:alert, no distress and comfortable LYMPH:  no palpable lymphadenopathy in the cervical, axillary or inguinal LUNGS: clear to auscultation and percussion with normal breathing effort HEART: regular rate & rhythm and no murmurs and no lower extremity edema ABDOMEN:abdomen soft, non-tender and normal bowel sounds Musculoskeletal:no cyanosis of digits and no clubbing  NEURO: alert & oriented x 3 with fluent speech  LABORATORY DATA:  I have reviewed the data as listed  Lab Results  Component Value Date   WBC 15.2 (H) 07/03/2023   NEUTROABS 9.4 (H) 07/03/2023   HGB 15.9 (H) 07/03/2023   HCT 46.2 (H) 07/03/2023   MCV 90.6 07/03/2023   PLT 375 07/03/2023      Chemistry      Component Value Date/Time   NA 136 07/03/2023 1116   K 3.8 07/03/2023 1116   CL 103 07/03/2023 1116   CO2 20 (L) 07/03/2023 1116   BUN 13 07/03/2023 1116   CREATININE 0.74 07/03/2023 1116      Component Value Date/Time   CALCIUM 9.6 07/03/2023 1116   ALKPHOS 90 07/03/2023 1116   AST 17 07/03/2023 1116   ALT 20 07/03/2023 1116   BILITOT 1.0 07/03/2023 1116      Latest Reference Range & Units 07/03/23 11:16  Sed Rate 0 - 22 mm/hr 6    Latest Reference Range & Units 07/03/23 11:16  LDH 98 - 192 U/L 105  CRP <1.0 mg/dL 0.6   Flow cytometry: 1/61/09  DIAGNOSIS:    - No abnormal B or T-cell population identified   GATING AND PHENOTYPIC ANALYSIS:   Gated population: Flow cytometric immunophenotyping is performed using   antibodies to the antigens listed in the table below. Electronic gates  are placed around a cell cluster displaying light scatter properties  corresponding to: lymphocytes   Abnormal Cells in gated population: N/A   Phenotype of Abnormal Cells: N/A    RADIOGRAPHIC STUDIES: I have personally reviewed the radiological report as listed   MM 3D SCREENING MAMMOGRAM BILATERAL BREAST CLINICAL DATA:  Screening.  EXAM: DIGITAL SCREENING BILATERAL MAMMOGRAM WITH TOMOSYNTHESIS AND CAD  TECHNIQUE: Bilateral screening digital craniocaudal and mediolateral oblique mammograms were obtained. Bilateral screening digital breast tomosynthesis was performed. The images were evaluated with computer-aided detection.  COMPARISON:  Previous exam(s).  ACR Breast Density Category b: There are scattered areas of fibroglandular density.  FINDINGS: There are no findings suspicious for malignancy.  IMPRESSION: No mammographic evidence of malignancy. A result letter of this screening mammogram will be mailed directly to the patient.  RECOMMENDATION: Screening mammogram in one year. (Code:SM-B-01Y)  BI-RADS CATEGORY  1: Negative.  Electronically Signed   By: Coralyn Mark.D.  On: 07/17/2023 12:14

## 2023-07-17 NOTE — Assessment & Plan Note (Signed)
 Obesity likely contributing to leukocytosis. - Encouraged weight loss through dietary modifications and physical activity.

## 2023-07-17 NOTE — Patient Instructions (Signed)
 VISIT SUMMARY:  You came in today for a follow-up after recent tests showed a high white blood cell count. You reported no concerning symptoms and mentioned feeling tired due to working two jobs. You are actively trying to quit smoking and have successfully reduced your smoking to a pack every three days.  YOUR PLAN:  -LEUKOCYTOSIS: Leukocytosis means having a higher than normal white blood cell count, which can be due to various reasons including obesity and smoking. Your tests did not show signs of leukemia or inflammation. We recommend you continue your efforts to quit smoking and come back in six months for another lab test. Please report any new symptoms such as fever, chills, unexplained weight loss, loss of appetite, or severe fatigue.  -OBESITY: Obesity means having an excessive amount of body fat, which can contribute to various health issues including a high white blood cell count. We encourage you to work on losing weight through healthy eating and regular physical activity.  -TOBACCO USE DISORDER: Tobacco use disorder means having a dependence on tobacco products. You are currently smoking less than a pack a day and are making efforts to quit. We support your efforts to stop smoking and encourage you to continue reducing your tobacco use.  INSTRUCTIONS:  Please follow up in six months for lab reassessment. Report any new symptoms such as fever, chills, unexplained weight loss, loss of appetite, or severe fatigue.

## 2023-07-17 NOTE — Assessment & Plan Note (Signed)
 Smoking less than a pack daily, actively attempting cessation. - Supported smoking cessation efforts.

## 2023-07-17 NOTE — Assessment & Plan Note (Signed)
 Chronic leukocytosis since 2011.  Likely secondary to obesity and tobacco use.  Flow cytometry negative.  ESR and CRP normal. - Continue to monitor counts for now - Advised smoking cessation. - Instructed to report new symptoms such as fever, chills, unexplained weight loss, anorexia, or severe fatigue.  Follow-up in six months with labs for reassessment.

## 2024-01-15 ENCOUNTER — Inpatient Hospital Stay

## 2024-01-15 ENCOUNTER — Inpatient Hospital Stay: Attending: Oncology

## 2024-01-15 DIAGNOSIS — E669 Obesity, unspecified: Secondary | ICD-10-CM | POA: Diagnosis not present

## 2024-01-15 DIAGNOSIS — F17210 Nicotine dependence, cigarettes, uncomplicated: Secondary | ICD-10-CM | POA: Diagnosis not present

## 2024-01-15 DIAGNOSIS — D72820 Lymphocytosis (symptomatic): Secondary | ICD-10-CM | POA: Diagnosis present

## 2024-01-15 DIAGNOSIS — D751 Secondary polycythemia: Secondary | ICD-10-CM | POA: Insufficient documentation

## 2024-01-15 DIAGNOSIS — Z79899 Other long term (current) drug therapy: Secondary | ICD-10-CM | POA: Insufficient documentation

## 2024-01-15 DIAGNOSIS — Z887 Allergy status to serum and vaccine status: Secondary | ICD-10-CM | POA: Diagnosis not present

## 2024-01-15 DIAGNOSIS — D72825 Bandemia: Secondary | ICD-10-CM

## 2024-01-15 DIAGNOSIS — Z886 Allergy status to analgesic agent status: Secondary | ICD-10-CM | POA: Insufficient documentation

## 2024-01-15 LAB — CBC WITH DIFFERENTIAL/PLATELET
Abs Immature Granulocytes: 0.06 K/uL (ref 0.00–0.07)
Basophils Absolute: 0.1 K/uL (ref 0.0–0.1)
Basophils Relative: 1 %
Eosinophils Absolute: 0.4 K/uL (ref 0.0–0.5)
Eosinophils Relative: 3 %
HCT: 46.6 % — ABNORMAL HIGH (ref 36.0–46.0)
Hemoglobin: 16.3 g/dL — ABNORMAL HIGH (ref 12.0–15.0)
Immature Granulocytes: 0 %
Lymphocytes Relative: 35 %
Lymphs Abs: 5 K/uL — ABNORMAL HIGH (ref 0.7–4.0)
MCH: 31.7 pg (ref 26.0–34.0)
MCHC: 35 g/dL (ref 30.0–36.0)
MCV: 90.5 fL (ref 80.0–100.0)
Monocytes Absolute: 1 K/uL (ref 0.1–1.0)
Monocytes Relative: 7 %
Neutro Abs: 7.7 K/uL (ref 1.7–7.7)
Neutrophils Relative %: 54 %
Platelets: 383 K/uL (ref 150–400)
RBC: 5.15 MIL/uL — ABNORMAL HIGH (ref 3.87–5.11)
RDW: 13.8 % (ref 11.5–15.5)
WBC: 14.2 K/uL — ABNORMAL HIGH (ref 4.0–10.5)
nRBC: 0 % (ref 0.0–0.2)

## 2024-01-15 LAB — COMPREHENSIVE METABOLIC PANEL WITH GFR
ALT: 16 U/L (ref 0–44)
AST: 15 U/L (ref 15–41)
Albumin: 4.8 g/dL (ref 3.5–5.0)
Alkaline Phosphatase: 118 U/L (ref 38–126)
Anion gap: 14 (ref 5–15)
BUN: 18 mg/dL (ref 6–20)
CO2: 21 mmol/L — ABNORMAL LOW (ref 22–32)
Calcium: 10.6 mg/dL — ABNORMAL HIGH (ref 8.9–10.3)
Chloride: 100 mmol/L (ref 98–111)
Creatinine, Ser: 0.79 mg/dL (ref 0.44–1.00)
GFR, Estimated: >60 mL/min (ref >60)
Glucose, Bld: 160 mg/dL — ABNORMAL HIGH (ref 70–99)
Potassium: 4.4 mmol/L (ref 3.5–5.1)
Sodium: 135 mmol/L (ref 135–145)
Total Bilirubin: 0.4 mg/dL (ref 0.0–1.2)
Total Protein: 7.8 g/dL (ref 6.5–8.1)

## 2024-01-21 NOTE — Progress Notes (Signed)
 " [facility] at [facility]  HEMATOLOGY FOLLOW-UP VISIT  [name], MD  REASON FOR FOLLOW-UP: Leukocytosis  ASSESSMENT & PLAN:  Patient is a 53 y.o. female following for leukocytosis  Assessment and Plan Assessment & Plan Lymphocytosis and erythrocytosis  Persistent lymphocytosis and erythrocytosis with high WBC count which has been stable. Differential includes chronic leukemia and other hematologic conditions. Smoking may contribute. Further evaluation needed to rule out other causes.   Flow cytometry negative, no B symptoms.  - Will order BCR-ABL test for chronic leukemia. - Will order JAK2 mutation test for other hematologic conditions. - Schedule follow-up phone visit in two weeks to follow-up on test results.  Tobacco use disorder Significant reduction in smoking to 1-2 cigarettes/day. Smoking may contribute to elevated blood counts.  - Encourage continued smoking cessation efforts.  Obesity Obesity may contribute to elevated blood counts.  - Encourage continued lifestyle modifications.  Suspected sleep apnea Reports snoring and difficulty sleeping. Previous sleep study 20-30 years ago.  Can contribute to erythrocytosis  - Refer for sleep study evaluation.    Orders Placed This Encounter  Procedures   BCR-ABL1 FISH    Standing Status:   Future    Number of Occurrences:   1    Expected Date:   01/22/2024    Expiration Date:   04/21/2024   JAK2 [id number] rfx CALR/MPL/E12-15    Standing Status:   Future    Number of Occurrences:   1    Expected Date:   01/22/2024    Expiration Date:   04/21/2024    The total time spent in the appointment was 20 minutes encounter with patients including review of chart and various tests results, discussions about plan of care and coordination of care plan   All questions were answered. The patient knows to call the clinic with any problems, questions or concerns. No barriers to learning was detected.  Mickiel Dry, MD 10/9/20252:22 PM   SUMMARY OF HEMATOLOGIC HISTORY: Chronic leukocytosis since at least 2011 -ESR, CRP: Normal -Flow cytometry: Negative -Risk factors: Tobacco smoking, obesity   INTERVAL HISTORY: Discussed the use of AI scribe software for clinical note transcription with the patient, who gave verbal consent to proceed.  History of Present Illness Kerianne Gurr is a 53 year old female who presents for follow-up of elevated white blood cell count and also has elevated hemoglobin levels at this time.  She has elevated white blood cell and hemoglobin levels. Previous flow cytometry was negative. No recent fever, chills, or infections.  She has a history of smoking but has significantly reduced her intake to one or two cigarettes per day after starting a new job. Her white blood cell count has decreased slightly since reducing smoking.  She experiences difficulty sleeping and snoring but denies waking up gasping for breath, daytime fatigue, or falling asleep at inappropriate times. She underwent a sleep study 20-30 years ago but has not been evaluated recently.  She mentions a recent cold or allergies with a cough. She reports her weight has been more or less stable since her last visit.    I have reviewed the past medical history, past surgical history, social history and family history with the patient   ALLERGIES:  is allergic to haemophilus b polysaccharide vaccine, influenza virus vaccine, ibuprofen , metformin , semaglutide(0.25 or 0.5mg -dos), and zolpidem.  MEDICATIONS:  Current Outpatient Medications  Medication Sig Dispense Refill   acetaminophen  (TYLENOL ) 500 MG tablet Take 1,000 mg by mouth every 6 (  six) hours as needed (arthritis pain).     amLODipine  (NORVASC ) 10 MG tablet Take 10 mg by mouth every morning.     blood glucose meter kit and supplies KIT Dispense based on patient and insurance preference. Use up to four times daily as directed. (FOR ICD-9 250.00,  250.01). 1 each 0   Continuous Glucose Sensor (DEXCOM G7 SENSOR) MISC Inject 1 sensor to the skin every 10 days for continuous glucose monitoring.     DULoxetine (CYMBALTA) 60 MG capsule Take 120 mg by mouth every morning.     FARXIGA 10 MG TABS tablet Take 10 mg by mouth daily.     gabapentin (NEURONTIN) 300 MG capsule Take 1 capsule by mouth at bedtime.     Insulin  Glargine (BASAGLAR  KWIKPEN) 100 UNIT/ML SMARTSIG:30 Unit(s) SUB-Q Daily     losartan  (COZAAR ) 100 MG tablet Take 1 tablet (100 mg total) by mouth daily. (Patient taking differently: Take 100 mg by mouth every morning.) 30 tablet 0   pregabalin (LYRICA) 75 MG capsule Take 75-150 mg by mouth at bedtime.     No current facility-administered medications for this visit.     REVIEW OF SYSTEMS:   Constitutional: Denies fevers, chills or night sweats Eyes: Denies blurriness of vision Ears, nose, mouth, throat, and face: Denies mucositis or sore throat Respiratory: Denies cough, dyspnea or wheezes Cardiovascular: Denies palpitation, chest discomfort or lower extremity swelling Gastrointestinal:  Denies nausea, heartburn or change in bowel habits Skin: Denies abnormal skin rashes Lymphatics: Denies new lymphadenopathy or easy bruising Neurological:Denies numbness, tingling or new weaknesses Behavioral/Psych: Mood is stable, no new changes  All other systems were reviewed with the patient and are negative.  PHYSICAL EXAMINATION:   Vitals:   01/22/24 0942 01/22/24 0943  BP: (!) 144/99 121/83  Pulse: 100   Resp: 20   Temp: 100 F (37.8 C)   SpO2: 100%      GENERAL:alert, no distress and comfortable LYMPH:  no palpable lymphadenopathy in the cervical, axillary or inguinal LUNGS: clear to auscultation and percussion with normal breathing effort HEART: regular rate & rhythm and no murmurs and no lower extremity edema ABDOMEN:abdomen soft, non-tender and normal bowel sounds Musculoskeletal:no cyanosis of digits and no clubbing   NEURO: alert & oriented x 3 with fluent speech  LABORATORY DATA:  I have reviewed the data as listed  Lab Results  Component Value Date   WBC 14.2 (H) 01/15/2024   NEUTROABS 7.7 01/15/2024   HGB 16.3 (H) 01/15/2024   HCT 46.6 (H) 01/15/2024   MCV 90.5 01/15/2024   PLT 383 01/15/2024      Chemistry      Component Value Date/Time   NA 135 01/15/2024 1530   K 4.4 01/15/2024 1530   CL 100 01/15/2024 1530   CO2 21 (L) 01/15/2024 1530   BUN 18 01/15/2024 1530   CREATININE 0.79 01/15/2024 1530      Component Value Date/Time   CALCIUM 10.6 (H) 01/15/2024 1530   ALKPHOS 118 01/15/2024 1530   AST 15 01/15/2024 1530   ALT 16 01/15/2024 1530   BILITOT 0.4 01/15/2024 1530      Latest Reference Range & Units 07/03/23 11:16  Sed Rate 0 - 22 mm/hr 6    Latest Reference Range & Units 07/03/23 11:16  LDH 98 - 192 U/L 105  CRP <1.0 mg/dL 0.6   Flow cytometry: 6/79/74  DIAGNOSIS:    - No abnormal B or T-cell population identified   GATING AND PHENOTYPIC ANALYSIS:  Gated population: Flow cytometric immunophenotyping is performed using  antibodies to the antigens listed in the table below. Electronic gates  are placed around a cell cluster displaying light scatter properties  corresponding to: lymphocytes   Abnormal Cells in gated population: N/A   Phenotype of Abnormal Cells: N/A    RADIOGRAPHIC STUDIES: I have personally reviewed the radiological report as listed   MM 3D SCREENING MAMMOGRAM BILATERAL BREAST CLINICAL DATA:  Screening.  EXAM: DIGITAL SCREENING BILATERAL MAMMOGRAM WITH TOMOSYNTHESIS AND CAD  TECHNIQUE: Bilateral screening digital craniocaudal and mediolateral oblique mammograms were obtained. Bilateral screening digital breast tomosynthesis was performed. The images were evaluated with computer-aided detection.  COMPARISON:  Previous exam(s).  ACR Breast Density Category b: There are scattered areas of fibroglandular  density.  FINDINGS: There are no findings suspicious for malignancy.  IMPRESSION: No mammographic evidence of malignancy. A result letter of this screening mammogram will be mailed directly to the patient.  RECOMMENDATION: Screening mammogram in one year. (Code:SM-B-01Y)  BI-RADS CATEGORY  1: Negative.  Electronically Signed   By: Delon Music M.D.   On: 07/17/2023 12:14    "

## 2024-01-22 ENCOUNTER — Inpatient Hospital Stay

## 2024-01-22 ENCOUNTER — Inpatient Hospital Stay: Admitting: Oncology

## 2024-01-22 VITALS — BP 121/83 | HR 100 | Temp 100.0°F | Resp 20 | Wt 228.6 lb

## 2024-01-22 DIAGNOSIS — D72820 Lymphocytosis (symptomatic): Secondary | ICD-10-CM

## 2024-01-22 NOTE — Patient Instructions (Signed)
 [facility] at [facility] Discharge Instructions   You were seen and examined today by Dr. Davonna.  She reviewed the results of your lab work which are normal/stable. Your white blood cell count, specifically lymphocytes, remain elevated. We will do additional lab work today to investigate this further.   We will see you back in 2 weeks to discuss lab results.   Return as scheduled.    Thank you for choosing [facility] at [facility] to provide your oncology and hematology care.  To afford each patient quality time with our provider, please arrive at least 15 minutes before your scheduled appointment time.   If you have a lab appointment with the Cancer Center please come in thru the Main Entrance and check in at the main information desk.  You need to re-schedule your appointment should you arrive 10 or more minutes late.  We strive to give you quality time with our providers, and arriving late affects you and other patients whose appointments are after yours.  Also, if you no show three or more times for appointments you may be dismissed from the clinic at the providers discretion.     Again, thank you for choosing [facility].  Our hope is that these requests will decrease the amount of time that you wait before being seen by our physicians.       _____________________________________________________________  Should you have questions after your visit to [facility], please contact our office at [phone] and follow the prompts.  Our office hours are 8:00 a.m. and 4:30 p.m. Monday - Friday.  Please note that voicemails left after 4:00 p.m. may not be returned until the following business day.  We are closed weekends and major holidays.  You do have access to a nurse 24-7, just call the main number to the clinic [phone] and do not press any options, hold on the line and a nurse will answer the phone.    For  prescription refill requests, have your pharmacy contact our office and allow 72 hours.    Due to Covid, you will need to wear a mask upon entering the hospital. If you do not have a mask, a mask will be given to you at the Main Entrance upon arrival. For doctor visits, patients may have 1 support person age 50 or older with them. For treatment visits, patients can not have anyone with them due to social distancing guidelines and our immunocompromised population.

## 2024-01-29 LAB — JAK2 V617F RFX CALR/MPL/E12-15

## 2024-01-29 LAB — CALR +MPL + E12-E15  (REFLEX)

## 2024-01-29 LAB — BCR-ABL1 FISH
Cells Analyzed: 200
Cells Counted: 200

## 2024-02-10 ENCOUNTER — Inpatient Hospital Stay: Admitting: Oncology

## 2024-02-12 ENCOUNTER — Inpatient Hospital Stay: Admitting: Oncology

## 2024-02-12 DIAGNOSIS — D72825 Bandemia: Secondary | ICD-10-CM | POA: Diagnosis not present

## 2024-02-12 DIAGNOSIS — D751 Secondary polycythemia: Secondary | ICD-10-CM | POA: Diagnosis not present

## 2024-02-12 DIAGNOSIS — D72820 Lymphocytosis (symptomatic): Secondary | ICD-10-CM

## 2024-02-12 DIAGNOSIS — Z720 Tobacco use: Secondary | ICD-10-CM | POA: Diagnosis not present

## 2024-02-12 NOTE — Progress Notes (Signed)
 [facility] at [facility]  Hematology Follow-up Virtual Visit via Telephone Note  [name], MD  I connected with Lisa [name] on 02/12/24 at  8:20 AM EDT by telephone and verified that I am speaking with the correct person using two identifiers.  Location: Patient: At home Provider: At office, [facility]  REASON FOR FOLLOW-UP: Leukocytosis and erythrocytosis  ASSESSMENT & PLAN:  Patient is a 53 y.o. female following for leukocytosis and erythrocytosis  Leukocytosis and erythrocytosis Persistent lymphocytosis and erythrocytosis with high WBC count which has been stable.  Smoking and obesity may contribute.  Flow cytometry negative, no B symptoms. ESR, CRP, LDH: Normal BCR-ABL, JAK2/CALR/MPL/E 12-15: Negative   - Will obtain an EPO level with next blood draw - Patient is still awaiting on sleep study referral.  Will follow-up on this. - If he points elevated, will consider abdominal ultrasound. - Patient has no rheumatological symptoms at this time and will not do autoimmune workup.  Can consider this in future if needed  Return to clinic in 6 months with labs  Tobacco use disorder Significant reduction in smoking to 1-2 cigarettes/day. Smoking may contribute to elevated blood counts.   - Encourage continued smoking cessation efforts.  Obesity Can contribute to leukocytosis - Encouraged continued lifestyle modifications  Suspected sleep apnea Reports snoring and difficulty sleeping. Previous sleep study 20-30 years ago.  Can contribute to erythrocytosis   - Referred to sleep study on last visit.  Patient is currently awaiting orthopedic   Orders Placed This Encounter  Procedures   CBC with Differential    Standing Status:   Future    Expected Date:   08/09/2024    Expiration Date:   11/07/2024   Comprehensive metabolic panel    Standing Status:   Future    Expected Date:   08/09/2024    Expiration Date:   11/07/2024    Erythropoietin    Standing Status:   Future    Expected Date:   08/09/2024    Expiration Date:   11/07/2024    I discussed the assessment and treatment plan with the patient. The patient was provided an opportunity to ask questions and all were answered. The patient agreed with the plan and demonstrated an understanding of the instructions.   The patient was advised to call back or seek an in-person evaluation if the symptoms worsen or if the condition fails to improve as anticipated.  I provided 20 minutes of non-face-to-face time during this encounter.   Mickiel Dry, MD 10/30/202512:36 PM    INTERVAL HISTORY:  I discussed the limitations, risks, security and privacy concerns of performing an evaluation and management service by telephone and the availability of in person appointments. I also discussed with the patient that there may be a patient responsible charge related to this service. The patient expressed understanding and agreed to proceed.   Lisa [name] 53 y.o. female following for leukocytosis and erythrocytosis.  Patient reports no complaints today.  She has cut down smoking to 1 to 2 cigarettes/day from at least half a pack she is still waiting on her sleep study referral and has not heard back.  She denies fever, chills, weight loss, loss of appetite, night sweats.  Overall, she is doing well.   I have reviewed the past medical history, past surgical history, social history and family history with the patient   ALLERGIES:  is allergic to haemophilus b polysaccharide vaccine, influenza virus vaccine, ibuprofen , metformin , semaglutide(0.25 or  0.5mg -dos), and zolpidem.  MEDICATIONS:  Current Outpatient Medications  Medication Sig Dispense Refill   acetaminophen  (TYLENOL ) 500 MG tablet Take 1,000 mg by mouth every 6 (six) hours as needed (arthritis pain).     amLODipine  (NORVASC ) 10 MG tablet Take 10 mg by mouth every morning.     blood glucose meter kit and supplies  KIT Dispense based on patient and insurance preference. Use up to four times daily as directed. (FOR ICD-9 250.00, 250.01). 1 each 0   Continuous Glucose Sensor (DEXCOM G7 SENSOR) MISC Inject 1 sensor to the skin every 10 days for continuous glucose monitoring.     DULoxetine (CYMBALTA) 60 MG capsule Take 120 mg by mouth every morning.     FARXIGA 10 MG TABS tablet Take 10 mg by mouth daily.     gabapentin (NEURONTIN) 300 MG capsule Take 1 capsule by mouth at bedtime.     Insulin  Glargine (BASAGLAR  KWIKPEN) 100 UNIT/ML SMARTSIG:30 Unit(s) SUB-Q Daily     losartan  (COZAAR ) 100 MG tablet Take 1 tablet (100 mg total) by mouth daily. (Patient taking differently: Take 100 mg by mouth every morning.) 30 tablet 0   OZEMPIC, 0.25 OR 0.5 MG/DOSE, 2 MG/3ML SOPN SMARTSIG:0.25 Milligram(s) SUB-Q Once a Week     pregabalin (LYRICA) 75 MG capsule Take 75-150 mg by mouth at bedtime.     No current facility-administered medications for this visit.     REVIEW OF SYSTEMS:   Constitutional: Denies fevers, chills or night sweats Eyes: Denies blurriness of vision Ears, nose, mouth, throat, and face: Denies mucositis or sore throat Respiratory: Denies cough, dyspnea or wheezes Cardiovascular: Denies palpitation, chest discomfort or lower extremity swelling Gastrointestinal:  Denies nausea, heartburn or change in bowel habits Skin: Denies abnormal skin rashes Lymphatics: Denies new lymphadenopathy or easy bruising Neurological:Denies numbness, tingling or new weaknesses Behavioral/Psych: Mood is stable, no new changes  All other systems were reviewed with the patient and are negative.  PHYSICAL EXAMINATION: Deferred for virtual visit  LABORATORY DATA:  I have reviewed the data as listed  Lab Results  Component Value Date   WBC 14.2 (H) 01/15/2024   NEUTROABS 7.7 01/15/2024   HGB 16.3 (H) 01/15/2024   HCT 46.6 (H) 01/15/2024   MCV 90.5 01/15/2024   PLT 383 01/15/2024      Component Value Date/Time    NA 135 01/15/2024 1530   K 4.4 01/15/2024 1530   CL 100 01/15/2024 1530   CO2 21 (L) 01/15/2024 1530   GLUCOSE 160 (H) 01/15/2024 1530   BUN 18 01/15/2024 1530   CREATININE 0.79 01/15/2024 1530   CALCIUM 10.6 (H) 01/15/2024 1530   PROT 7.8 01/15/2024 1530   ALBUMIN 4.8 01/15/2024 1530   AST 15 01/15/2024 1530   ALT 16 01/15/2024 1530   ALKPHOS 118 01/15/2024 1530   BILITOT 0.4 01/15/2024 1530   GFRNONAA >60 01/15/2024 1530   GFRAA >60 08/20/2019 0312     01/22/24 10:19  BCR-ABL1 FISH Negative  CALR +MPL + E12-E15 (reflexed) Negative  JAK2 V617F rfx CALR/MPL/E12-15 Negative    RADIOGRAPHIC STUDIES: I have personally reviewed the radiological images as listed and agreed with the findings in the report.  No results found.

## 2024-02-12 NOTE — Patient Instructions (Signed)
 [facility] at [facility] Discharge Instructions   You were seen and examined today by Dr. Davonna.  She reviewed the results of your lab work which are normal/stable.   We will see you back in 6 months. We will repeat lab work prior to this visit.   Return as scheduled.    Thank you for choosing  Cancer Center at [facility] to provide your oncology and hematology care.  To afford each patient quality time with our provider, please arrive at least 15 minutes before your scheduled appointment time.   If you have a lab appointment with the Cancer Center please come in thru the Main Entrance and check in at the main information desk.  You need to re-schedule your appointment should you arrive 10 or more minutes late.  We strive to give you quality time with our providers, and arriving late affects you and other patients whose appointments are after yours.  Also, if you no show three or more times for appointments you may be dismissed from the clinic at the providers discretion.     Again, thank you for choosing [facility]  Our hope is that these requests will decrease the amount of time that you wait before being seen by our physicians.       _____________________________________________________________  Should you have questions after your visit to [facility], please contact our office at [phone] and follow the prompts.  Our office hours are 8:00 a.m. and 4:30 p.m. Monday - Friday.  Please note that voicemails left after 4:00 p.m. may not be returned until the following business day.  We are closed weekends and major holidays.  You do have access to a nurse 24-7, just call the main number to the clinic [phone] and do not press any options, hold on the line and a nurse will answer the phone.    For prescription refill requests, have your pharmacy contact our office and allow 72 hours.    Due to Covid, you will  need to wear a mask upon entering the hospital. If you do not have a mask, a mask will be given to you at the Main Entrance upon arrival. For doctor visits, patients may have 1 support person age 4 or older with them. For treatment visits, patients can not have anyone with them due to social distancing guidelines and our immunocompromised population.

## 2024-04-12 ENCOUNTER — Encounter: Payer: Self-pay | Admitting: *Deleted

## 2024-08-12 ENCOUNTER — Inpatient Hospital Stay

## 2024-08-19 ENCOUNTER — Inpatient Hospital Stay: Admitting: Oncology
